# Patient Record
Sex: Male | Born: 1990 | Race: White | Hispanic: No | Marital: Single | Smoking: Never smoker
Health system: Southern US, Community
[De-identification: ages and names within clinical notes are randomized; demographics above are authoritative.]

---
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
## 2013-06-25 ENCOUNTER — Emergency Department: Payer: Self-pay | Admitting: Emergency Medicine

## 2013-06-25 LAB — CBC WITH DIFFERENTIAL/PLATELET
BASOS ABS: 0 10*3/uL (ref 0.0–0.1)
BASOS PCT: 0.3 %
EOS PCT: 2.1 %
Eosinophil #: 0.2 10*3/uL (ref 0.0–0.7)
HCT: 42.9 % (ref 40.0–52.0)
HGB: 14.7 g/dL (ref 13.0–18.0)
Lymphocyte #: 1.8 10*3/uL (ref 1.0–3.6)
Lymphocyte %: 20.7 %
MCH: 31.1 pg (ref 26.0–34.0)
MCHC: 34.3 g/dL (ref 32.0–36.0)
MCV: 91 fL (ref 80–100)
MONOS PCT: 10.8 %
Monocyte #: 0.9 x10 3/mm (ref 0.2–1.0)
NEUTROS ABS: 5.6 10*3/uL (ref 1.4–6.5)
Neutrophil %: 66.1 %
Platelet: 204 10*3/uL (ref 150–440)
RBC: 4.72 10*6/uL (ref 4.40–5.90)
RDW: 13.1 % (ref 11.5–14.5)
WBC: 8.5 10*3/uL (ref 3.8–10.6)

## 2014-04-07 ENCOUNTER — Emergency Department: Payer: Self-pay | Admitting: Emergency Medicine

## 2015-08-29 ENCOUNTER — Encounter: Payer: Self-pay | Admitting: Emergency Medicine

## 2015-08-29 ENCOUNTER — Emergency Department: Payer: Self-pay

## 2015-08-29 ENCOUNTER — Emergency Department
Admission: EM | Admit: 2015-08-29 | Discharge: 2015-08-29 | Disposition: A | Discharge status: Home / Self Care | Payer: Self-pay | Attending: Emergency Medicine | Admitting: Emergency Medicine

## 2015-08-29 DIAGNOSIS — F1290 Cannabis use, unspecified, uncomplicated: Secondary | ICD-10-CM | POA: Insufficient documentation

## 2015-08-29 DIAGNOSIS — Z5321 Procedure and treatment not carried out due to patient leaving prior to being seen by health care provider: Secondary | ICD-10-CM | POA: Insufficient documentation

## 2015-08-29 DIAGNOSIS — R1011 Right upper quadrant pain: Secondary | ICD-10-CM | POA: Insufficient documentation

## 2015-08-29 LAB — URINALYSIS COMPLETE WITH MICROSCOPIC (ARMC ONLY)
BILIRUBIN URINE: NEGATIVE
GLUCOSE, UA: NEGATIVE mg/dL
Hgb urine dipstick: NEGATIVE
LEUKOCYTES UA: NEGATIVE
NITRITE: NEGATIVE
Protein, ur: 100 mg/dL — AB
Specific Gravity, Urine: 1.021 (ref 1.005–1.030)
Squamous Epithelial / LPF: NONE SEEN
pH: 9 — ABNORMAL HIGH (ref 5.0–8.0)

## 2015-08-29 LAB — CBC
HCT: 48.2 % (ref 40.0–52.0)
Hemoglobin: 16.5 g/dL (ref 13.0–18.0)
MCH: 29.9 pg (ref 26.0–34.0)
MCHC: 34.1 g/dL (ref 32.0–36.0)
MCV: 87.6 fL (ref 80.0–100.0)
Platelets: 292 10*3/uL (ref 150–440)
RBC: 5.5 MIL/uL (ref 4.40–5.90)
RDW: 13.6 % (ref 11.5–14.5)
WBC: 17.4 10*3/uL — AB (ref 3.8–10.6)

## 2015-08-29 LAB — LIPASE, BLOOD: Lipase: 26 U/L (ref 11–51)

## 2015-08-29 LAB — COMPREHENSIVE METABOLIC PANEL
ALK PHOS: 51 U/L (ref 38–126)
ALT: 33 U/L (ref 17–63)
AST: 25 U/L (ref 15–41)
Albumin: 4.3 g/dL (ref 3.5–5.0)
Anion gap: 10 (ref 5–15)
BILIRUBIN TOTAL: 0.9 mg/dL (ref 0.3–1.2)
BUN: 11 mg/dL (ref 6–20)
CALCIUM: 9.2 mg/dL (ref 8.9–10.3)
CO2: 25 mmol/L (ref 22–32)
Chloride: 102 mmol/L (ref 101–111)
Creatinine, Ser: 0.83 mg/dL (ref 0.61–1.24)
GFR calc Af Amer: >60 mL/min (ref >60)
GLUCOSE: 115 mg/dL — AB (ref 65–99)
POTASSIUM: 3.6 mmol/L (ref 3.5–5.1)
Sodium: 137 mmol/L (ref 135–145)
TOTAL PROTEIN: 7.2 g/dL (ref 6.5–8.1)

## 2015-08-29 MED ORDER — IOPAMIDOL (ISOVUE-300) INJECTION 61%
100.0000 mL | Freq: Once | INTRAVENOUS | Status: AC | PRN
  Administered 2015-08-29: 100 mL via INTRAVENOUS

## 2015-08-29 MED ORDER — FENTANYL CITRATE (PF) 100 MCG/2ML IJ SOLN
50.0000 ug | INTRAMUSCULAR | Status: AC | PRN
Start: 2015-08-29 — End: 2015-08-29
  Administered 2015-08-29 (×2): 50 ug via INTRAVENOUS

## 2015-08-29 MED ORDER — DIATRIZOATE MEGLUMINE & SODIUM 66-10 % PO SOLN
15.0000 mL | Freq: Once | ORAL | Status: AC
  Administered 2015-08-29: 15 mL via ORAL

## 2015-08-29 MED ORDER — ONDANSETRON HCL 4 MG/2ML IJ SOLN
INTRAMUSCULAR | Status: AC
  Filled 2015-08-29: qty 2

## 2015-08-29 MED ORDER — SODIUM CHLORIDE 0.9 % IV SOLN
Freq: Once | INTRAVENOUS | Status: AC
  Administered 2015-08-29: 15:00:00 via INTRAVENOUS

## 2015-08-29 MED ORDER — FENTANYL CITRATE (PF) 100 MCG/2ML IJ SOLN
INTRAMUSCULAR | Status: AC
Start: 2015-08-29 — End: 2015-08-29
  Administered 2015-08-29: 50 ug via INTRAVENOUS
  Filled 2015-08-29: qty 2

## 2015-08-29 MED ORDER — ONDANSETRON HCL 4 MG/2ML IJ SOLN
4.0000 mg | Freq: Once | INTRAMUSCULAR | Status: AC
  Administered 2015-08-29: 4 mg via INTRAVENOUS

## 2015-08-29 NOTE — ED Notes (Signed)
Patient to ER for c/o RLQ abdominal pain. Patient states pain started at umbilicus, then began pain like "hot lava pain" to RLQ. Patient crying in triage d/t pain. Patient reports several episodes of vomiting before pain. Denies history of kidney stones.

## 2015-08-29 NOTE — ED Notes (Signed)
[place]  Patient upset over wait-time.  Patient asked when he would receive his CT Scan results. Patient informed that he would not receive his results until he saw a physician and that I was unable to give him an exact time.  Patient stated that "This is ridiculous, I have been her 5 1/2 hours"  Tried to reassure patient that he had not been here 5 1/2 hours, rather that he had only been waiting less than 3.  I aplogized to patient for the wait time.  He stated that he was no longer waiting and [facility]

## 2015-08-30 ENCOUNTER — Emergency Department
Admission: EM | Admit: 2015-08-30 | Discharge: 2015-08-31 | Disposition: A | Discharge status: Home / Self Care | Payer: Self-pay | Attending: Emergency Medicine | Admitting: Emergency Medicine

## 2015-08-30 ENCOUNTER — Emergency Department: Payer: Self-pay

## 2015-08-30 ENCOUNTER — Encounter: Payer: Self-pay | Admitting: Emergency Medicine

## 2015-08-30 DIAGNOSIS — F1290 Cannabis use, unspecified, uncomplicated: Secondary | ICD-10-CM | POA: Insufficient documentation

## 2015-08-30 DIAGNOSIS — R1031 Right lower quadrant pain: Secondary | ICD-10-CM | POA: Insufficient documentation

## 2015-08-30 LAB — COMPREHENSIVE METABOLIC PANEL
ALK PHOS: 51 U/L (ref 38–126)
ALT: 32 U/L (ref 17–63)
ANION GAP: 10 (ref 5–15)
AST: 28 U/L (ref 15–41)
Albumin: 4.4 g/dL (ref 3.5–5.0)
BUN: 13 mg/dL (ref 6–20)
CALCIUM: 9.5 mg/dL (ref 8.9–10.3)
CHLORIDE: 102 mmol/L (ref 101–111)
CO2: 29 mmol/L (ref 22–32)
Creatinine, Ser: 0.91 mg/dL (ref 0.61–1.24)
GFR calc non Af Amer: >60 mL/min (ref >60)
Glucose, Bld: 130 mg/dL — ABNORMAL HIGH (ref 65–99)
Potassium: 3.5 mmol/L (ref 3.5–5.1)
SODIUM: 141 mmol/L (ref 135–145)
Total Bilirubin: 0.9 mg/dL (ref 0.3–1.2)
Total Protein: 7.7 g/dL (ref 6.5–8.1)

## 2015-08-30 LAB — CBC
HCT: 48.1 % (ref 40.0–52.0)
HEMOGLOBIN: 16.4 g/dL (ref 13.0–18.0)
MCH: 30 pg (ref 26.0–34.0)
MCHC: 34.1 g/dL (ref 32.0–36.0)
MCV: 87.8 fL (ref 80.0–100.0)
Platelets: 315 10*3/uL (ref 150–440)
RBC: 5.48 MIL/uL (ref 4.40–5.90)
RDW: 13.3 % (ref 11.5–14.5)
WBC: 17.8 10*3/uL — ABNORMAL HIGH (ref 3.8–10.6)

## 2015-08-30 LAB — LIPASE, BLOOD: LIPASE: 12 U/L (ref 11–51)

## 2015-08-30 MED ORDER — ONDANSETRON HCL 4 MG/2ML IJ SOLN
4.0000 mg | Freq: Once | INTRAMUSCULAR | Status: AC
  Administered 2015-08-30: 4 mg via INTRAVENOUS
  Filled 2015-08-30: qty 2

## 2015-08-30 MED ORDER — HYDROMORPHONE HCL 1 MG/ML IJ SOLN
1.0000 mg | Freq: Once | INTRAMUSCULAR | Status: AC
  Administered 2015-08-30: 1 mg via INTRAVENOUS
  Filled 2015-08-30: qty 1

## 2015-08-30 MED ORDER — SODIUM CHLORIDE 0.9 % IV BOLUS (SEPSIS)
1000.0000 mL | Freq: Once | INTRAVENOUS | Status: AC
  Administered 2015-08-30: 1000 mL via INTRAVENOUS

## 2015-08-30 MED ORDER — ONDANSETRON 4 MG PO TBDP
4.0000 mg | ORAL_TABLET | Freq: Once | ORAL | Status: AC | PRN
Start: 2015-08-30 — End: 2015-08-30
  Administered 2015-08-30: 4 mg via ORAL
  Filled 2015-08-30: qty 1

## 2015-08-30 NOTE — ED Notes (Signed)
Phone report to [employer], [employer]ity fundraiser

## 2015-08-30 NOTE — ED Notes (Signed)
Pt anxious in triage; pt says he was seen here last night for N/V and burning abd pain; had CT scan but says he had to leave before he got results; pt says pain is no better

## 2015-08-31 ENCOUNTER — Emergency Department: Payer: Self-pay

## 2015-08-31 MED ORDER — ONDANSETRON HCL 4 MG/2ML IJ SOLN
4.0000 mg | Freq: Once | INTRAMUSCULAR | Status: AC
  Administered 2015-08-31: 4 mg via INTRAVENOUS
  Filled 2015-08-31: qty 2

## 2015-08-31 MED ORDER — OXYCODONE-ACETAMINOPHEN 5-325 MG PO TABS
2.0000 | ORAL_TABLET | Freq: Once | ORAL | Status: AC
  Administered 2015-08-31: 2 via ORAL
  Filled 2015-08-31: qty 2

## 2015-08-31 MED ORDER — IOPAMIDOL (ISOVUE-300) INJECTION 61%
100.0000 mL | Freq: Once | INTRAVENOUS | Status: AC | PRN
  Administered 2015-08-31: 100 mL via INTRAVENOUS

## 2015-08-31 MED ORDER — DIATRIZOATE MEGLUMINE & SODIUM 66-10 % PO SOLN
15.0000 mL | Freq: Once | ORAL | Status: AC
  Administered 2015-08-31: 15 mL via ORAL

## 2015-08-31 MED ORDER — OXYCODONE-ACETAMINOPHEN 5-325 MG PO TABS: 1.0000 | ORAL_TABLET | Freq: Four times a day (QID) | ORAL | Status: DC | PRN

## 2015-08-31 MED ORDER — ONDANSETRON 4 MG PO TBDP: 4.0000 mg | ORAL_TABLET | Freq: Three times a day (TID) | ORAL | Status: DC | PRN

## 2015-08-31 MED ORDER — HYDROMORPHONE HCL 1 MG/ML IJ SOLN
1.0000 mg | Freq: Once | INTRAMUSCULAR | Status: AC
  Administered 2015-08-31: 1 mg via INTRAVENOUS
  Filled 2015-08-31: qty 1

## 2015-08-31 MED ORDER — ONDANSETRON 4 MG PO TBDP
4.0000 mg | ORAL_TABLET | Freq: Once | ORAL | Status: AC
  Administered 2015-08-31: 4 mg via ORAL

## 2015-08-31 MED ORDER — ONDANSETRON 4 MG PO TBDP
ORAL_TABLET | ORAL | Status: AC
  Administered 2015-08-31: 4 mg via ORAL
  Filled 2015-08-31: qty 1

## 2015-08-31 NOTE — ED Notes (Signed)
Patient c/o dry mouth, got him some lemon mouth swabs and they were effective.

## 2015-08-31 NOTE — ED Provider Notes (Signed)
Patient care assumed from Dr. Huel CoteQuigley. Patient continues with significant right lower quadrant tenderness to palpation on my exam. Patient's ultrasound is largely normal besides mild hepatic ST or ptosis. Patient had a normal CT scan yesterday continues to have an elevated white blood cell count of 17,000. She has nauseated has been vomiting at home for 2 days denies any diarrhea. Normal bowel movement yesterday. Given the patient's significant right lower quadrant tenderness to palpation discussed with Dr. Tonita CongWoodham of general surgery. He has reviewed the patient's CT scan, states the appendix appears normal, but given the significant tenderness we may benefit from a repeat CT scan to rule out appendicitis. I discussed this with the patient, he is agreeable to repeat CT scan tonight to rule out appendicitis.  Repeat CT scan shows no acute findings, possible gastritis but the patient's pain is in the right lower quadrant. Appendix is normal. We will discharge the patient on pain and nausea medication have him follow up with his primary care doctor in 2 days for recheck. I discussed return precautions for any worsening pain or fever, patient agreeable.  Minna AntisKevin Sequoyah Counterman, MD 08/31/15 [id number]

## 2015-08-31 NOTE — Discharge Instructions (Signed)
Return to the emergency department for any worsening pain, fever, or any other symptom personally concerning to yourself. Otherwise please follow-up with a primary care physician a 2-3 days for recheck/reevaluation.   Abdominal Pain, Adult Many things can cause belly (abdominal) pain. Most times, the belly pain is not dangerous. Many cases of belly pain can be watched and treated at home. HOME CARE   Do not take medicines that help you go poop (laxatives) unless told to by your doctor.  Only take medicine as told by your doctor.  Eat or drink as told by your doctor. Your doctor will tell you if you should be on a special diet. GET HELP IF:  You do not know what is causing your belly pain.  You have belly pain while you are sick to your stomach (nauseous) or have runny poop (diarrhea).  You have pain while you pee or poop.  Your belly pain wakes you up at night.  You have belly pain that gets worse or better when you eat.  You have belly pain that gets worse when you eat fatty foods.  You have a fever. GET HELP RIGHT AWAY IF:   The pain does not go away within 2 hours.  You keep throwing up (vomiting).  The pain changes and is only in the right or left part of the belly.  You have bloody or tarry looking poop. MAKE SURE YOU:   Understand these instructions.  Will watch your condition.  Will get help right away if you are not doing well or get worse.   This information is not intended to replace advice given to you by your health care provider. Make sure you discuss any questions you have with your health care provider.   Document Released: 09/21/2007 Document Revised: 04/25/2014 Document Reviewed: 12/12/2012 Elsevier Interactive Patient Education [employer]2016 Elsevier Inc.

## 2015-08-31 NOTE — ED Notes (Signed)
Patient returned from CT

## 2015-08-31 NOTE — ED Notes (Signed)
Patient transported to CT 

## 2015-09-09 NOTE — ED Provider Notes (Addendum)
Time Seen: Approximately 2100  I have reviewed the triage notes  Chief Complaint: Abdominal Pain and Emesis   History of Present Illness: Jesse Dominguez is a 25 y.o. male who presents with acute onset of epigastric and diffuse abdominal pain. He states the pain started toward the umbilical area and now radiates toward her right lower quadrant and describes it as a "" hot pain "". Patient states he had some nausea and vomited multiple times prior to his discomfort. He denies any dysuria or hematuria. He denies any left-sided abdominal pain. He is not aware of any fever at home.   History reviewed. No pertinent past medical history.  There are no active problems to display for this patient.   History reviewed. No pertinent past surgical history.  History reviewed. No pertinent past surgical history.  Current Outpatient Rx  Name  Route  Sig  Dispense  Refill  . ondansetron (ZOFRAN ODT) 4 MG disintegrating tablet   Oral   Take 1 tablet (4 mg total) by mouth every 8 (eight) hours as needed for nausea or vomiting.   20 tablet   0   . oxyCODONE-acetaminophen (ROXICET) 5-325 MG tablet   Oral   Take 1 tablet by mouth every 6 (six) hours as needed.   20 tablet   0     Allergies:  Review of patient's allergies indicates no known allergies.  Family History: History reviewed. No pertinent family history.  Social History: Social History  Substance Use Topics  . Smoking status: Never Smoker   . Smokeless tobacco: None  . Alcohol Use: No     Review of Systems:   10 point review of systems was performed and was otherwise negative:  Constitutional: No fever Eyes: No visual disturbances ENT: No sore throat, ear pain Cardiac: No chest pain Respiratory: No shortness of breath, wheezing, or stridor Abdomen: Patient's pain started toward the umbilical area and then started radiating toward her right lower quadrant Endocrine: No weight loss, No night sweats Extremities: No  peripheral edema, cyanosis Skin: No rashes, easy bruising Neurologic: No focal weakness, trouble with speech or swollowing Urologic: No dysuria, Hematuria, or urinary frequency   Physical Exam:  ED Triage Vitals  Enc Vitals Group     BP 08/30/15 2057 136/89 mmHg     Pulse Rate 08/30/15 2057 112     Resp 08/30/15 2057 22     Temp 08/30/15 2057 97.5 F (36.4 C)     Temp Source 08/30/15 2057 Oral     SpO2 08/30/15 2057 100 %     Weight 08/30/15 2057 238 lb (107.956 kg)     Height 08/30/15 2057 6' (1.829 m)     Head Cir --      Peak Flow --      Pain Score 08/30/15 2101 9     Pain Loc --      Pain Edu? --      Excl. in GC? --     General: Awake , Alert , and Oriented times 3; GCS 15 Head: Normal cephalic , atraumatic Eyes: Pupils equal , round, reactive to light Nose/Throat: No nasal drainage, patent upper airway without erythema or exudate.  Neck: Supple, Full range of motion, No anterior adenopathy or palpable thyroid masses Lungs: Clear to ascultation without wheezes , rhonchi, or rales Heart: Regular rate, regular rhythm without murmurs , gallops , or rubs Abdomen:Mild tenderness to deep palpation without rebound, guarding , or rigidity; bowel sounds positive and symmetric  in all 4 quadrants. No organomegaly .        Extremities: 2 plus symmetric pulses. No edema, clubbing or cyanosis Neurologic: normal ambulation, Motor symmetric without deficits, sensory intact Skin: warm, dry, no rashes   Labs:   All laboratory work was reviewed including any pertinent negatives or positives listed below:  Labs Reviewed  COMPREHENSIVE METABOLIC PANEL - Abnormal; Notable for the following:    Glucose, Bld 130 (*)    All other components within normal limits  CBC - Abnormal; Notable for the following:    WBC 17.8 (*)    All other components within normal limits  LIPASE, BLOOD       Radiology:  Final result by Rad Results In Interface (08/31/15 01:47:58)   Narrative:    CLINICAL DATA: 25 year old male with right lower quadrant abdominal pain  EXAM: CT ABDOMEN AND PELVIS WITH CONTRAST  TECHNIQUE: Multidetector CT imaging of the abdomen and pelvis was performed using the standard protocol following bolus administration of intravenous contrast.  CONTRAST: 100mL ISOVUE-300 IOPAMIDOL (ISOVUE-300) INJECTION 61%  COMPARISON: CT dated 08/29/2015 and ultrasound dated 08/30/2015  FINDINGS: The visualized lung bases are clear. No intra-abdominal free air or free fluid.  Diffuse fatty infiltration of the liver. The gallbladder, pancreas, spleen, and adrenal glands appear unremarkable. The kidneys, visualized ureters, and urinary bladder appear unremarkable. The prostate and seminal vesicles are grossly unremarkable.  There is apparent thickening of the gastric antrum and pylorus which may be related to underdistention. Gastritis is less likely but not excluded. Clinical correlation is recommended. There is no evidence of bowel obstruction. Normal appendix.  The abdominal aorta and IVC appear unremarkable. No portal venous gas identified. There is no adenopathy. The abdominal wall soft tissues and the osseous structures appear unremarkable.  IMPRESSION: Underdistention versus gastritis. Clinical correlation is recommended. No bowel obstruction. Normal appendix.  Fatty liver.   Electronically Signed By: Elgie CollardArash Radparvar M.D. On: 08/31/2015 01:47            I personally reviewed the radiologic studies    ED Course: * Patient's abdominal CT was actually pending prior to my departure from the emergency department. I do not have a strong clinical suspicion for an acute abdomen and felt most of his symptoms seem to be viral in nature with. He does not have a lot of diarrhea but certainly we've been seeing a lot of gastroenteritis in any area. Patient's pending CT was checked out to my colleague prior to my departure from the emergency  department Patient was given IV fluids, anti-medic therapy, and anti-pain medication   Assessment: Acute unspecified abdominal pain Final Clinical Impression:  Final diagnoses:  Right lower quadrant abdominal pain     Plan:  Discharge home the CT does not show any evidence of acute appendicitis*            Jennye MoccasinBrian S Quigley, MD 09/09/15 1419  Jennye MoccasinBrian S Quigley, MD 09/09/15 1538

## 2016-08-31 ENCOUNTER — Encounter: Payer: Self-pay | Admitting: Emergency Medicine

## 2016-08-31 ENCOUNTER — Emergency Department
Admission: EM | Admit: 2016-08-31 | Discharge: 2016-08-31 | Disposition: A | Discharge status: Home / Self Care | Payer: Self-pay | Attending: Emergency Medicine | Admitting: Emergency Medicine

## 2016-08-31 DIAGNOSIS — K047 Periapical abscess without sinus: Secondary | ICD-10-CM | POA: Insufficient documentation

## 2016-08-31 MED ORDER — PENICILLIN V POTASSIUM 500 MG PO TABS: 500.0000 mg | ORAL_TABLET | Freq: Four times a day (QID) | ORAL | 0 refills | Status: AC

## 2016-08-31 MED ORDER — PENICILLIN V POTASSIUM 500 MG PO TABS
500.0000 mg | ORAL_TABLET | Freq: Once | ORAL | Status: AC
  Administered 2016-08-31: 500 mg via ORAL
  Filled 2016-08-31: qty 1

## 2016-08-31 NOTE — Discharge Instructions (Signed)
Take medication as prescribed.  ° °Return to emergency department if symptoms worsen and follow-up with PCP as needed.   ° ° °OPTIONS FOR DENTAL FOLLOW UP CARE ° °[place] [facility] - Local Safety Net Dental Clinics °http://www.ncdhhs.gov/dph/oralhealth/services/safetynetclinics.htm °  °[facility] (336-562-3123) ° °[facility] [place] (919-933-9087) ° °[facility] ([phone] ext [phone]) ° °[facility] ([phone]) ° °[facility] ([phone]) °This clinic caters to the indigent population and is on a lottery system. °Location: °[facility], [address], [place] °Clinic Hours: °Wednesdays from 6pm - 9pm, patients seen by a lottery system. °For dates, call or go to www.med.[facility].edu/[facility]/patients/Dental-[facility] °Services: °Cleanings, fillings and simple extractions. °Payment Options: °DENTAL WORK IS FREE OF CHARGE. Bring proof of income or support. °Best way to get seen: °Arrive at 5:15 pm - this is a lottery, NOT first come/first serve, so arriving earlier will not increase your chances of being seen. °  °  °[facility] °919-537-3737 °Select option 1 for emergencies °  °Location: °[facility], [address], [place] °Clinic Hours: °No walk-ins accepted - call the day before to schedule an appointment. °Check in times are 9:30 am and 1:30 pm. °Services: °Simple extractions, temporary fillings, pulpectomy/pulp debridement, uncomplicated abscess drainage. °Payment Options: °PAYMENT IS DUE AT THE TIME OF SERVICE.  Fee is usually $100-200, additional surgical procedures (e.g. abscess drainage) may be extra. °Cash, checks, Visa/MasterCard accepted.  Can file Medicaid if patient is covered for dental - patient should call case worker to check. °No discount for [facility] patients. °Best way to get seen: °MUST call the day before and get onto the  schedule. Can usually be seen the next 1-2 days. No walk-ins accepted. °  °  °[facility] °[phone] °  °Location: °[facility], [address], [place] °Clinic Hours: °M, W, Th, F 8am or 1:30pm, Tues 9a or 1:30 - first come/first served. °Services: °Simple extractions, temporary fillings, uncomplicated abscess drainage.  You do not need to be an [place] resident. °Payment Options: °PAYMENT IS DUE AT THE TIME OF SERVICE. °Dental insurance, otherwise sliding scale - bring proof of income or support. °Depending on income and treatment needed, cost is usually $50-200. °Best way to get seen: °Arrive early as it is first come/first served. °  °  °[facility] °[phone] °  °Location: °[address] °Clinic Hours: °Mon-Thu 8a-5p °Services: °Most basic dental services including extractions and fillings. °Payment Options: °PAYMENT IS DUE AT THE TIME OF SERVICE. °Sliding scale, up to 50% off - bring proof if income or support. °Medicaid with dental option accepted. °Best way to get seen: °Call to schedule an appointment, can usually be seen within 2 weeks OR they will try to see walk-ins - show up at 8a or 2p (you may have to wait). °  °  °[facility] °919-245-2435 °[place] RESIDENTS ONLY °  °Location: °[facility], [address], [place], [place] [address] °Clinic Hours: By appointment only. °Monday - Thursday 8am-5pm, Friday 8am-12pm °Services: Cleanings, fillings, extractions. °Payment Options: °PAYMENT IS DUE AT THE TIME OF SERVICE. °Cash, Visa or MasterCard. Sliding scale - $30 minimum per service. °Best way to get seen: °Come in to office, complete packet and make an appointment - need proof of income °or support monies for each household member and proof of [place] residence. °Usually   takes about a month to get in. °  °  °[facility] °[phone] °   °Location: °[address] [place] °Clinic Hours: Walk-in Urgent Care Dental Services are offered Monday-Friday mornings only. °The numbers of emergencies accepted daily is limited to the number of °providers available. °Maximum 15 - Mondays, Wednesdays & Thursdays °Maximum 10 - Tuesdays & Fridays °Services: °You do not need to be a [place] resident to be seen for a dental emergency. °Emergencies are defined as pain, swelling, abnormal bleeding, or dental trauma. Walkins will receive x-rays if needed. °NOTE: Dental cleaning is not an emergency. °Payment Options: °PAYMENT IS DUE AT THE TIME OF SERVICE. °Minimum co-pay is $40.00 for uninsured patients. °Minimum co-pay is $3.00 for Medicaid with dental coverage. °Dental Insurance is accepted and must be presented at time of visit. °Medicare does not cover dental. °Forms of payment: Cash, credit card, checks. °Best way to get seen: °If not previously registered with the clinic, walk-in dental registration begins at 7:15 am and is on a first come/first serve basis. °If previously registered with the clinic, call to make an appointment. °  °  °[facility] °[phone] °[place] RESIDENTS ONLY °  °Location: °[address], [place], [place] °Clinic Hours: °Mon-Thu 10a-2p °Services: Extractions only! °Payment Options: °FREE (donations accepted) - bring proof of income or support °Best way to get seen: °Call and schedule an appointment OR come at 8am on the 1st Monday of every month (except for holidays) when it is first come/first served. °  °  °[facility] °[phone] °  °Location: °[address], [place] °Clinic Hours: °Friday mornings °Services, Payment Options, Best way to get seen: °Call for info  °

## 2016-08-31 NOTE — ED Provider Notes (Signed)
[facility] Emergency Department Provider Note   ____________________________________________   I have reviewed the triage vital signs and the nursing notes.   HISTORY  Chief Complaint Dental Pain    HPI Jesse Dominguez is a 26 y.o. male presents dental pain x 3 days. Patient complains of swelling and pain along the left side of his lower jaw and cheek.  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 Patient self treated with Tylenol 1000 mg and Aleve OTC earlier today with minimal relief. Patient denies fever, chills, headache, vision changes, shortness of breath, abdominal pain, nausea and vomiting.  History reviewed. No pertinent past medical history.  There are no active problems to display for this patient.   No past surgical history on file.  Prior to Admission medications   Medication Sig Start Date End Date Taking? Authorizing Provider  ondansetron (ZOFRAN ODT) 4 MG disintegrating tablet Take 1 tablet (4 mg total) by mouth every 8 (eight) hours as needed for nausea or vomiting. 08/31/15   Minna Antis, MD  oxyCODONE-acetaminophen (ROXICET) 5-325 MG tablet Take 1 tablet by mouth every 6 (six) hours as needed. 08/31/15   Minna Antis, MD  penicillin v potassium (VEETID) 500 MG tablet Take 1 tablet (500 mg total) by mouth 4 (four) times daily. 08/31/16 09/07/16  Clancy Mullarkey, Jordan Likes, PA-C    Allergies Patient has no known allergies.  No family history on file.  Social History Social History  Substance Use Topics  . Smoking status: Never Smoker  . Smokeless tobacco: Never Used  . Alcohol use No    Review of Systems Constitutional: Negative for fever/chills Eyes: No visual changes. ENT: Negative for sore throat. Negative for difficulty swallowing Difficulty talking and left lower jaw dental secondary to swelling and pain Cardiovascular: Denies chest  pain. Respiratory: Denies cough Denies shortness of breath. Gastrointestinal: No abdominal pain.  No nausea, vomiting, diarrhea. Musculoskeletal: Negative for generalized body aches. Skin:Negative for rash. Neurological: Negative for headaches. Negative focal weakness or numbness.  ____________________________________________   PHYSICAL EXAM:  VITAL SIGNS: ED Triage Vitals  Enc Vitals Group     BP 08/31/16 0956 130/80     Pulse Rate 08/31/16 0956 91     Resp 08/31/16 0956 16     Temp 08/31/16 0956 98.3 F (36.8 C)     Temp Source 08/31/16 0956 Oral     SpO2 08/31/16 0956 97 %     Weight 08/31/16 0953 231 lb (104.8 kg)     Height 08/31/16 0953 6' (1.829 m)     Head Circumference --      Peak Flow --      Pain Score 08/31/16 1007 4     Pain Loc --      Pain Edu? --      Excl. in GC? --     Constitutional: Alert and oriented. Well appearing and in no acute distress. Afebrile Head: Normocephalic and atraumatic. Eyes: Conjunctivae are normal. PERRL. Normal extraocular movements. Nose: No congestion/rhinorrhea/epistaxis.  Mouth/Throat: Mucous membranes are moist. Dental caries, inflamed, erythematous left lower gumline localized in the gumline. Tender to palpation along soft tissue mandibular region, no induration noted in facial soft tissue.  Neck: Supple.  Hematological/Lymphatic/Immunological: No cervical lymphadenopathy. Cardiovascular: Normal rate, regular rhythm. Normal distal pulses. Respiratory: Normal respiratory effort. Lungs CTAB. Gastrointestinal: Soft and nontender. No distention. Musculoskeletal: Nontender with normal range of motion in all extremities.  Neurologic: Normal speech and language. No gross focal neurologic deficits are appreciated. Skin:  Skin is warm, dry and intact. No rash noted. Psychiatric: Mood and affect are normal. ____________________________________________   LABS (all labs ordered are listed, but only abnormal results are  displayed)  Labs Reviewed - No data to display ____________________________________________  EKG none ____________________________________________  RADIOLOGY none ____________________________________________   PROCEDURES  Procedure(s) performed:no    Critical Care performed: no ____________________________________________   INITIAL IMPRESSION / ASSESSMENT AND PLAN / ED COURSE  Pertinent labs & imaging results that were available during my care of the patient were reviewed by me and considered in my medical decision making (see chart for details).   Patient presents with left lower dental pain, erythema, inflammation consistent with infection possible abscess. Significant dental caries and poor dental hygiene contributing. Patient is afebrile at this time. Patient will be provided prescription for empiric antibiotic therapy, Penicillin V to begin until he attends dental appointment. Patient will be following up with a schedule dental appointment next Wednesday. Also instructed to rinse mouth with warm salt water.   Patient informed of clinical course, understand medical decision-making process, and agree with plan.  Patient was advised to follow up dental clinic and was also advised to return to the emergency department for symptoms that change or worsen if unable to schedule an appointment.      ____________________________________________   FINAL CLINICAL IMPRESSION(S) / ED DIAGNOSES  Final diagnoses:  Dental infection       NEW MEDICATIONS STARTED DURING THIS VISIT:  Discharge Medication List as of 08/31/2016 10:45 AM    START taking these medications   Details  penicillin v potassium (VEETID) 500 MG tablet Take 1 tablet (500 mg total) by mouth 4 (four) times daily., Starting Wed 08/31/2016, Until Wed 09/07/2016, Print         Note:  This document was prepared using Dragon voice recognition software and may include unintentional dictation errors.    Clois ComberLittle, Rewa Weissberg M, PA-C 08/31/16 1441    Aidan Caloca, Jordan Likesraci M, PA-C 08/31/16 1605    Merrily Brittleifenbark, Neil, MD 09/01/16 1247

## 2016-08-31 NOTE — ED Triage Notes (Signed)
C/O left lower jaw dental pain and swelling x 3 days.  Has taken 1000 mg of Tylenol this morning at 0630 and 1 OTC Alleve at 0630.  Patient has dentist appointment scheduled for next Wednesday

## 2016-08-31 NOTE — ED Notes (Signed)
Patient presents to the ED with swelling to the left side of his mouth/cheek.  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  Patient is in no obvious distress at this time.

## 2016-09-04 IMAGING — CT CT ABD-PELV W/ CM
2 of 4 series · 16 of 46 positions shown, 18 images · IV contrast (iopamidol)
Comparison: CT dated 08/29/2015 and ultrasound dated 08/30/2015

CLINICAL DATA: 24-year-old male with right lower quadrant abdominal
pain

EXAM:
CT ABDOMEN AND PELVIS WITH CONTRAST
TECHNIQUE: Multidetector CT imaging of the abdomen and pelvis was performed
using the standard protocol following bolus administration of
intravenous contrast.
CONTRAST:  100mL 8LGFOM-JTT IOPAMIDOL (8LGFOM-JTT) INJECTION 61%

[Series 2: routine abd pel with · axial · 0.77mm/px · z∈[-505,-25]mm · 13 of 106 slices shown, 15 images]
[im 5/106  soft-tissue]
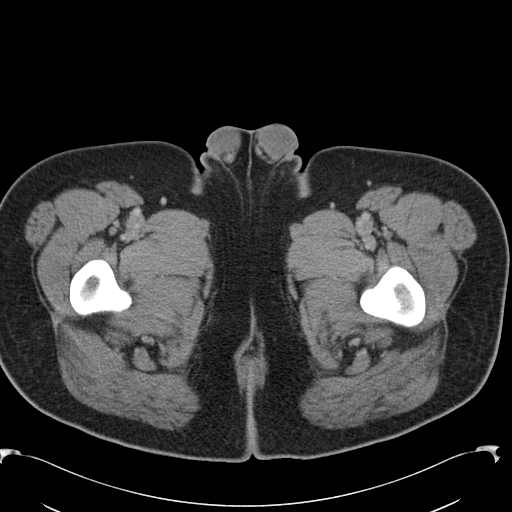
[im 5/106  bone]
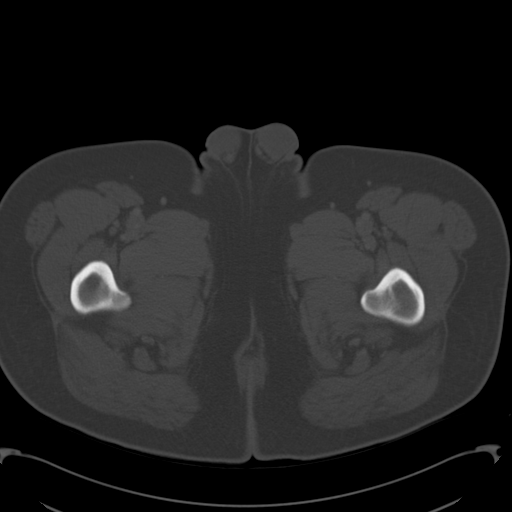
[im 14/106  soft-tissue]
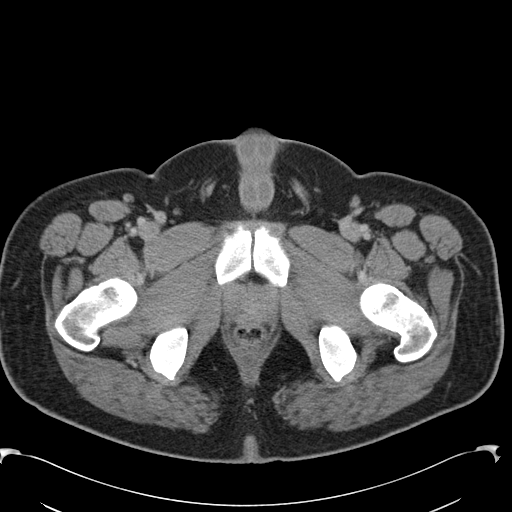
[im 22/106  soft-tissue]
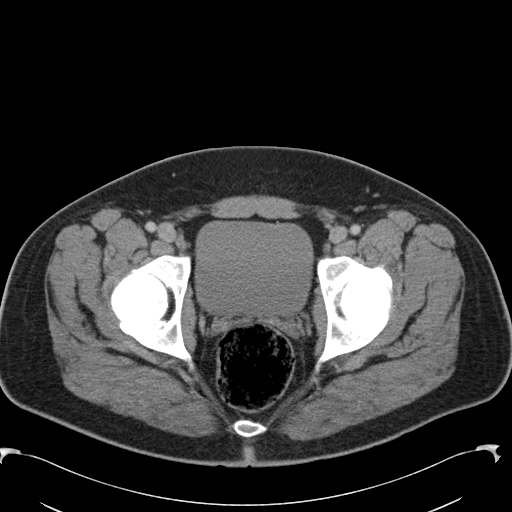
[im 31/106  soft-tissue]
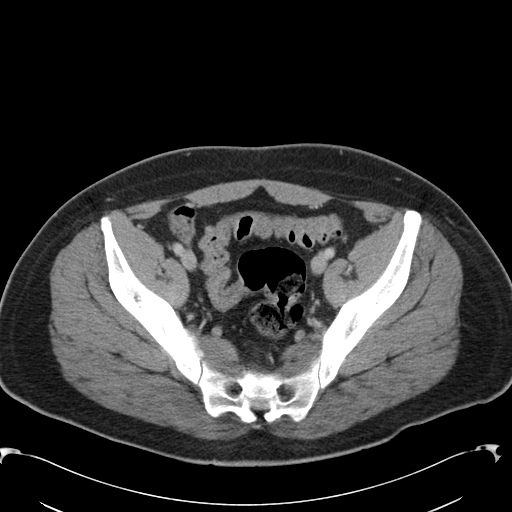
[im 36/106  soft-tissue]
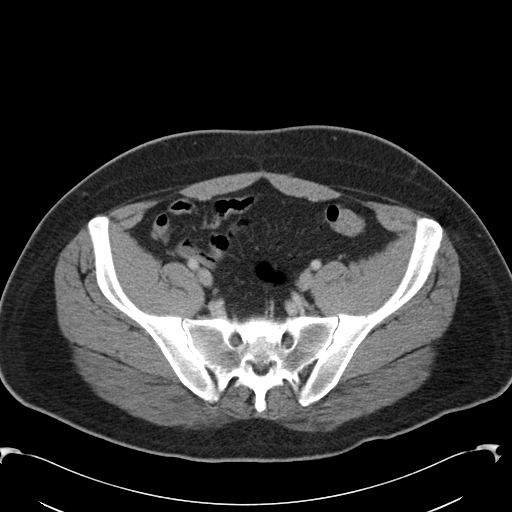
[im 44/106  soft-tissue]
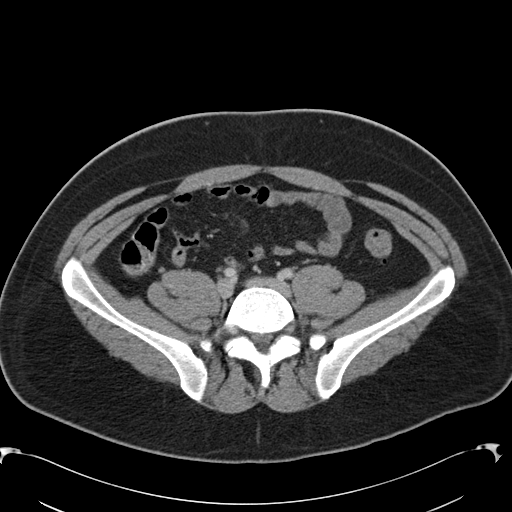
[im 53/106  soft-tissue]
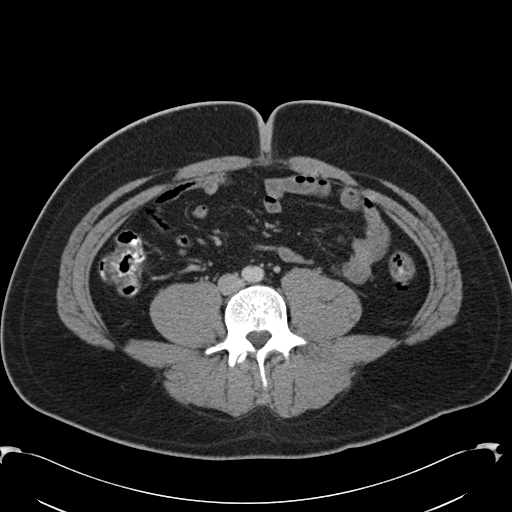
[im 62/106  soft-tissue]
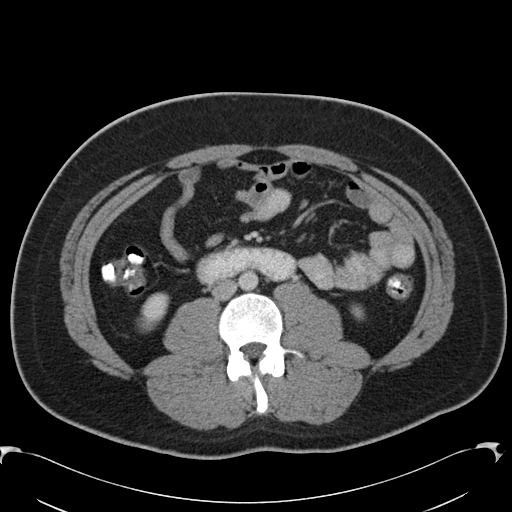
[im 71/106  soft-tissue]
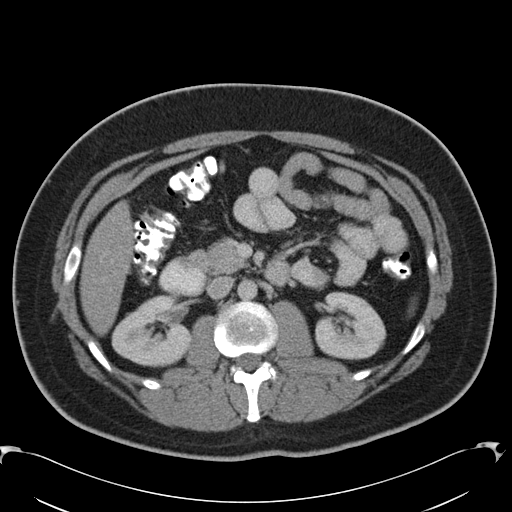
[im 71/106  bone]
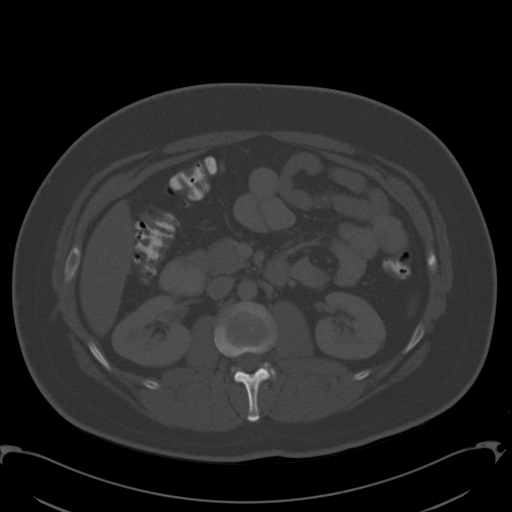
[im 75/106  soft-tissue]
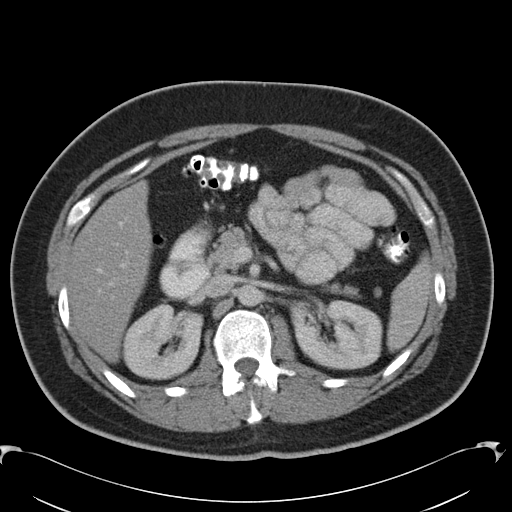
[im 84/106  soft-tissue]
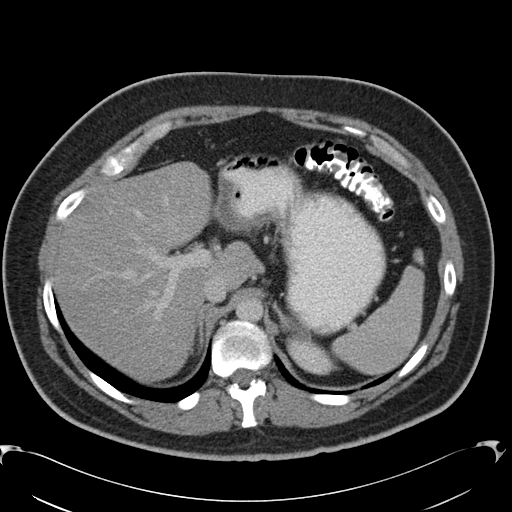
[im 92/106  soft-tissue]
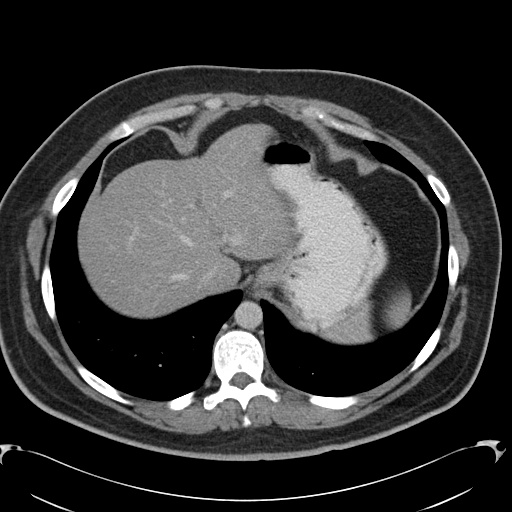
[im 101/106  soft-tissue]
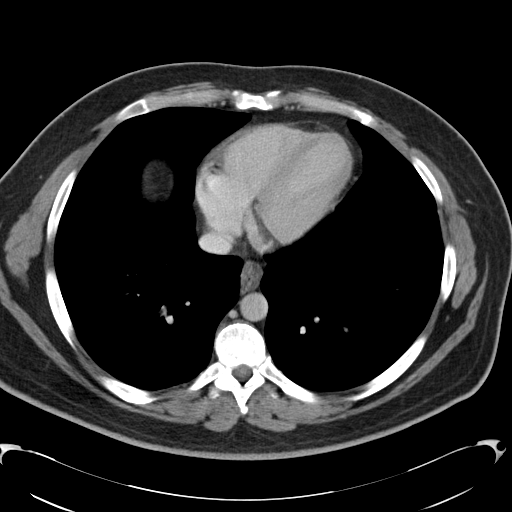

[Series 5: cor routine abd pel with · coronal · 0.78mm/px · 3 of 147 slices shown]
[im 49/147  soft-tissue]
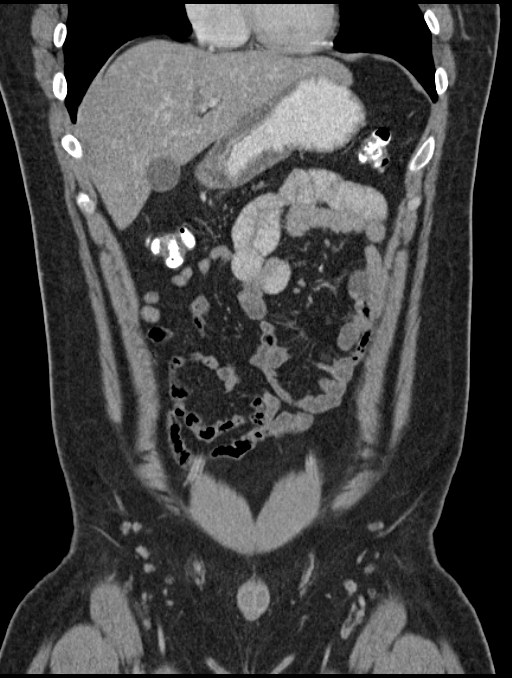
[im 65/147  soft-tissue]
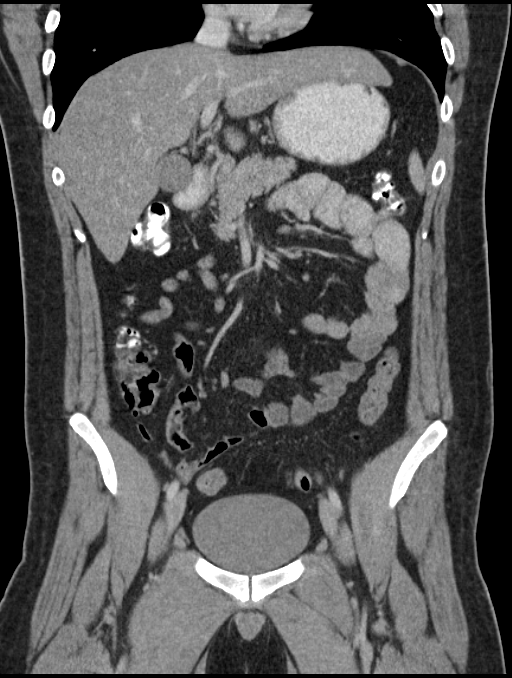
[im 82/147  soft-tissue]
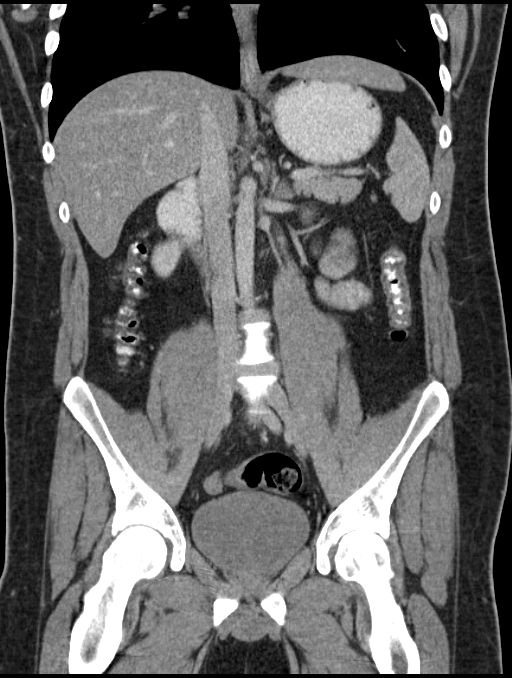

[16 of 46 positions shown; findings below may reference images not displayed]

FINDINGS: The visualized lung bases are clear. No intra-abdominal free air or
free fluid.

Diffuse fatty infiltration of the liver. The gallbladder, pancreas,
spleen, and adrenal glands appear unremarkable. The kidneys,
visualized ureters, and urinary bladder appear unremarkable. The
prostate and seminal vesicles are grossly unremarkable.

There is apparent thickening of the gastric antrum and pylorus which
may be related to underdistention. Gastritis is less likely but not
excluded. Clinical correlation is recommended. There is no evidence
of bowel obstruction. Normal appendix.

The abdominal aorta and IVC appear unremarkable. No portal venous
gas identified. There is no adenopathy. The abdominal wall soft
tissues and the osseous structures appear unremarkable.
IMPRESSION: Underdistention versus gastritis. Clinical correlation is
recommended. No bowel obstruction. Normal appendix.

Fatty liver.

## 2017-03-14 ENCOUNTER — Emergency Department: Payer: Self-pay

## 2017-03-14 ENCOUNTER — Other Ambulatory Visit: Payer: Self-pay

## 2017-03-14 ENCOUNTER — Emergency Department
Admission: EM | Admit: 2017-03-14 | Discharge: 2017-03-14 | Disposition: A | Discharge status: Home / Self Care | Payer: Self-pay | Attending: Emergency Medicine | Admitting: Emergency Medicine

## 2017-03-14 ENCOUNTER — Encounter: Payer: Self-pay | Admitting: Emergency Medicine

## 2017-03-14 DIAGNOSIS — M533 Sacrococcygeal disorders, not elsewhere classified: Secondary | ICD-10-CM | POA: Insufficient documentation

## 2017-03-14 DIAGNOSIS — L0591 Pilonidal cyst without abscess: Secondary | ICD-10-CM | POA: Insufficient documentation

## 2017-03-14 MED ORDER — TRAMADOL HCL 50 MG PO TABS: 50.0000 mg | ORAL_TABLET | Freq: Four times a day (QID) | ORAL | 0 refills | Status: AC | PRN

## 2017-03-14 MED ORDER — SULFAMETHOXAZOLE-TRIMETHOPRIM 800-160 MG PO TABS: 1.0000 | ORAL_TABLET | Freq: Two times a day (BID) | ORAL | 0 refills | Status: DC

## 2017-03-14 NOTE — ED Notes (Signed)
See triage note  States he developed pain to tailbone area for the past 2 days  Denies any injury.ambulates well to treatment room

## 2017-03-14 NOTE — ED Triage Notes (Signed)
Pt with tailbone pain for two day, no known injury.

## 2017-03-14 NOTE — Discharge Instructions (Signed)
All up with their regular doctor if you're not better in 3-5 days, use medication as prescribed, if you notice a large abscess appearing in the area please return to the emergency room for incision and drainage, take over-the-counter Tylenol and ibuprofen as needed, he will be given a prescription for an antibiotic and pain medication

## 2017-03-14 NOTE — ED Notes (Signed)
Pt alert and oriented X4, active, cooperative, pt in NAD. RR even and unlabored, color WNL.  Pt informed to return if any life threatening symptoms occur.  Discharge and followup instructions reviewed.  

## 2017-03-14 NOTE — ED Provider Notes (Signed)
[facility] Emergency Department Provider Note  ____________________________________________   First MD Initiated Contact with Patient 03/14/17 1026     (approximate)  I have reviewed the triage vital signs and the nursing notes.   HISTORY  Chief Complaint Tailbone Pain    HPI Jesse Dominguez is a 26 y.o. male complains of pain in the tailbone area, states he had a fracture a few years ago, states the pain feels the same, has not had an injury that he knows of, denies fever chills, did not take any over-the-counter medicines for pain   History reviewed. No pertinent past medical history.  There are no active problems to display for this patient.   History reviewed. No pertinent surgical history.  Prior to Admission medications   Medication Sig Start Date End Date Taking? Authorizing Provider  sulfamethoxazole-trimethoprim (BACTRIM DS,SEPTRA DS) 800-160 MG tablet Take 1 tablet by mouth 2 (two) times daily. 03/14/17   Sherrie MustacheFisher, Roselyn BeringSusan W, PA-C  traMADol (ULTRAM) 50 MG tablet Take 1 tablet (50 mg total) by mouth every 6 (six) hours as needed. 03/14/17 03/14/18  Faythe GheeFisher, Kanyah Matsushima W, PA-C    Allergies Patient has no known allergies.  No family history on file.  Social History Social History   Tobacco Use  . Smoking status: Never Smoker  . Smokeless tobacco: Never Used  Substance Use Topics  . Alcohol use: No  . Drug use: Yes    Types: Marijuana    Comment: last used Thursday    Review of Systems  Constitutional: No fever/chills Eyes: No visual changes. ENT: No sore throat. Respiratory: Denies cough Genitourinary: Negative for dysuria. Musculoskeletal: Positive for tailbone pain. Skin: Negative for rash.    ____________________________________________   PHYSICAL EXAM:  VITAL SIGNS: ED Triage Vitals  Enc Vitals Group     BP 03/14/17 1003 135/78     Pulse Rate 03/14/17 1003 86     Resp 03/14/17 1003 18     Temp 03/14/17 1003 98 F  (36.7 C)     Temp Source 03/14/17 1003 Oral     SpO2 03/14/17 1003 100 %     Weight 03/14/17 0941 232 lb (105.2 kg)     Height 03/14/17 0941 6' (1.829 m)     Head Circumference --      Peak Flow --      Pain Score 03/14/17 0941 5     Pain Loc --      Pain Edu? --      Excl. in [place]? --     Constitutional: Alert and oriented. Well appearing and in no acute distress. Eyes: Conjunctivae are normal.  Head: Atraumatic. Nose: No congestion/rhinnorhea. Mouth/Throat: Mucous membranes are moist.   Cardiovascular: Normal rate, regular rhythm. Respiratory: Normal respiratory effort.  No retractions GU: deferred Musculoskeletal: FROM all extremities, warm and well perfused, coccyx is tender to palpation, the area below the Kocsis is swollen and tender, no actual abscess noted Neurologic:  Normal speech and language.  Skin:  Skin is warm, dry and intact. No rash noted. Psychiatric: Mood and affect are normal. Speech and behavior are normal.  ____________________________________________   LABS (all labs ordered are listed, but only abnormal results are displayed)  Labs Reviewed - No data to display ____________________________________________   ____________________________________________  RADIOLOGY  X-ray of coccyx and sacrum is negative. ____________________________________________   PROCEDURES  Procedure(s) performed: No      ____________________________________________   INITIAL IMPRESSION / ASSESSMENT AND PLAN / ED COURSE  Pertinent  labs & imaging results that were available during my care of the patient were reviewed by me and considered in my medical decision making (see chart for details).  Patient is a 26 year old male with a history of a fractured coccyx, he presents today with increased pain to the tailbone area, denies any new injury, denies fever or chills, on physical exam the coccyx area is swollen and tender to palpation, x-ray of the coccyx and sacrum is  ordered    ----------------------------------------- 11:29 AM on 03/14/2017 -----------------------------------------  Patient's x-ray is normal, discussed pilonidal cyst with patient, prescription for Septra DS 1 by mouth twice a day for 7 days was given,  prescription for tramadol 50 mg #10 with no refill given, a she was instructed to follow-up with the acute care if not better in 3-5 days, to return to the emergency department if worsening, information was given about pilonidal cyst, and tailbone injuries   ____________________________________________   FINAL CLINICAL IMPRESSION(S) / ED DIAGNOSES  Final diagnoses:  Coccyx pain  Pilonidal cyst      NEW MEDICATIONS STARTED DURING THIS VISIT:  This SmartLink is deprecated. Use AVSMEDLIST instead to display the medication list for a patient.   Note:  This document was prepared using Dragon voice recognition software and may include unintentional dictation errors.    Faythe GheeFisher, Lisanne Ponce W, PA-C 03/14/17 1132    Jene EveryKinner, Robert, MD 03/14/17 [phone]

## 2017-03-16 ENCOUNTER — Encounter: Payer: Self-pay | Admitting: Intensive Care

## 2017-03-16 ENCOUNTER — Emergency Department
Admission: EM | Admit: 2017-03-16 | Discharge: 2017-03-16 | Disposition: A | Discharge status: Home / Self Care | Payer: Self-pay | Attending: Emergency Medicine | Admitting: Emergency Medicine

## 2017-03-16 DIAGNOSIS — L0501 Pilonidal cyst with abscess: Secondary | ICD-10-CM | POA: Insufficient documentation

## 2017-03-16 MED ORDER — OXYCODONE-ACETAMINOPHEN 7.5-325 MG PO TABS: 1.0000 | ORAL_TABLET | ORAL | 0 refills | Status: DC | PRN

## 2017-03-16 MED ORDER — HYDROMORPHONE HCL 1 MG/ML IJ SOLN
1.0000 mg | Freq: Once | INTRAMUSCULAR | Status: AC
  Administered 2017-03-16: 1 mg via INTRAMUSCULAR
  Filled 2017-03-16: qty 1

## 2017-03-16 NOTE — Discharge Instructions (Signed)
Continue previous medication and follow discharge care instructions. °

## 2017-03-16 NOTE — ED Notes (Signed)
Area is draining at present  Provider aware

## 2017-03-16 NOTE — ED Provider Notes (Signed)
[facility] Emergency Department Provider Note   ____________________________________________   None    (approximate)  I have reviewed the triage vital signs and the nursing notes.   HISTORY  Chief Complaint Abscess    HPI Jesse Dominguez is a 26 y.o. male patient presents with follow-up of pilonidal cyst which was nonfluctuant 2 days ago.Patient placed on antibiotics and tramadol. Patient state increased pressure today. Patient state after being brought to the emergency room and placed in a gown he laid down and noticed spontaneous relief with drainage from the cystic lesion. Patient state initial pain was 8/10. Patient rates the pain as a 4/10 at this time.   History reviewed. No pertinent past medical history.  There are no active problems to display for this patient.   History reviewed. No pertinent surgical history.  Prior to Admission medications   Medication Sig Start Date End Date Taking? Authorizing Provider  oxyCODONE-acetaminophen (PERCOCET) 7.5-325 MG tablet Take 1 tablet by mouth every 4 (four) hours as needed for severe pain. 03/16/17   Joni ReiningSmith, Libra Gatz K, PA-C  sulfamethoxazole-trimethoprim (BACTRIM DS,SEPTRA DS) 800-160 MG tablet Take 1 tablet by mouth 2 (two) times daily. 03/14/17   Sherrie MustacheFisher, Roselyn BeringSusan W, PA-C  traMADol (ULTRAM) 50 MG tablet Take 1 tablet (50 mg total) by mouth every 6 (six) hours as needed. 03/14/17 03/14/18  Faythe GheeFisher, [name], PA-C    Allergies Patient has no known allergies.  History reviewed. No pertinent family history.  Social History Social History   Tobacco Use  . Smoking status: Never Smoker  . Smokeless tobacco: Never Used  Substance Use Topics  . Alcohol use: No  . Drug use: Yes    Types: Marijuana    Comment: last used Thursday    Review of Systems Constitutional: No fever/chills Eyes: No visual changes. ENT: No sore throat. Cardiovascular: Denies chest pain. Respiratory: Denies shortness of  breath. Gastrointestinal: No abdominal pain.  No nausea, no vomiting.  No diarrhea.  No constipation. Genitourinary: Negative for dysuria. Musculoskeletal: Negative for back pain. Skin: Pilonidal cyst Neurological: Negative for headaches, focal weakness or numbness.  ____________________________________________   PHYSICAL EXAM:  VITAL SIGNS: ED Triage Vitals  Enc Vitals Group     BP 03/16/17 1106 139/75     Pulse Rate 03/16/17 1106 (!) 107     Resp 03/16/17 1106 16     Temp 03/16/17 1106 98.4 F (36.9 C)     Temp Source 03/16/17 1106 Oral     SpO2 03/16/17 1106 98 %     Weight 03/16/17 1107 240 lb (108.9 kg)     Height 03/16/17 1107 5\' 11"  (1.803 m)     Head Circumference --      Peak Flow --      Pain Score 03/16/17 1106 8     Pain Loc --      Pain Edu? --      Excl. in [place]? --    Constitutional: Alert and oriented. Well appearing and in no acute distress. Cardiovascular: Normal rate, regular rhythm. Grossly normal heart sounds.  Good peripheral circulation. Respiratory: Normal respiratory effort.  No retractions. Lungs CTAB. Gastrointestinal: Soft and nontender. No distention. No abdominal bruits. No CVA tenderness. Musculoskeletal: No lower extremity tenderness nor edema.  No joint effusions. Neurologic:  Normal speech and language. No gross focal neurologic deficits are appreciated. No gait instability. Skin: Erythematous and edematous nausea lesion left buttocks. Coarse amount of active drainage of purulent material .  Psychiatric: Mood  and affect are normal. Speech and behavior are normal.  ____________________________________________   LABS (all labs ordered are listed, but only abnormal results are displayed)  Labs Reviewed - No data to display ____________________________________________  EKG   ____________________________________________  RADIOLOGY  No results found.  ____________________________________________   PROCEDURES  Procedure(s)  performed: None  Procedures  Critical Care performed: No  ____________________________________________   INITIAL IMPRESSION / ASSESSMENT AND PLAN / ED COURSE  As part of my medical decision making, I reviewed the following data within the electronic MEDICAL RECORD NUMBER    Pilonidal cyst with active drainage. Manually compress lesion until no discharge was expressed. Area was clean and dress. Patient given discharge care instructions and advised continue medication. Patient advised to follow up by contacting surgical department for definitive evaluation and treatment.    ____________________________________________   FINAL CLINICAL IMPRESSION(S) / ED DIAGNOSES  Final diagnoses:  Pilonidal abscess     ED Discharge Orders        Ordered    oxyCODONE-acetaminophen (PERCOCET) 7.5-325 MG tablet  Every 4 hours PRN     03/16/17 1238       Note:  This document was prepared using Dragon voice recognition software and may include unintentional dictation errors.    Joni ReiningSmith, Infant Zink K, PA-C 03/16/17 1254    Jene EveryKinner, Robert, MD 03/16/17 1310

## 2017-03-16 NOTE — ED Triage Notes (Signed)
Patient reports he has a cyst at the top of his buttocks and was seen here two days ago and given antibiotics and tramadol. Since it has gotten bigger and redness around area has grown.

## 2017-03-16 NOTE — ED Notes (Signed)
See triage note  States abscess area is getting larger and having increased pain

## 2018-03-19 IMAGING — CR DG SACRUM/COCCYX 2+V
1 series · 3 of 3 positions shown · non-contrast
Comparison: None.

CLINICAL DATA: Sacrum coccyx pain for 2 days with no reported
injury. Previous coccyx fracture in 2893.

EXAM:
SACRUM AND COCCYX - 2+ VIEW

[Series 1: dg sacrum/coccyx · 0.14mm/px · 3 of 3 slices shown]
[im 1/3]
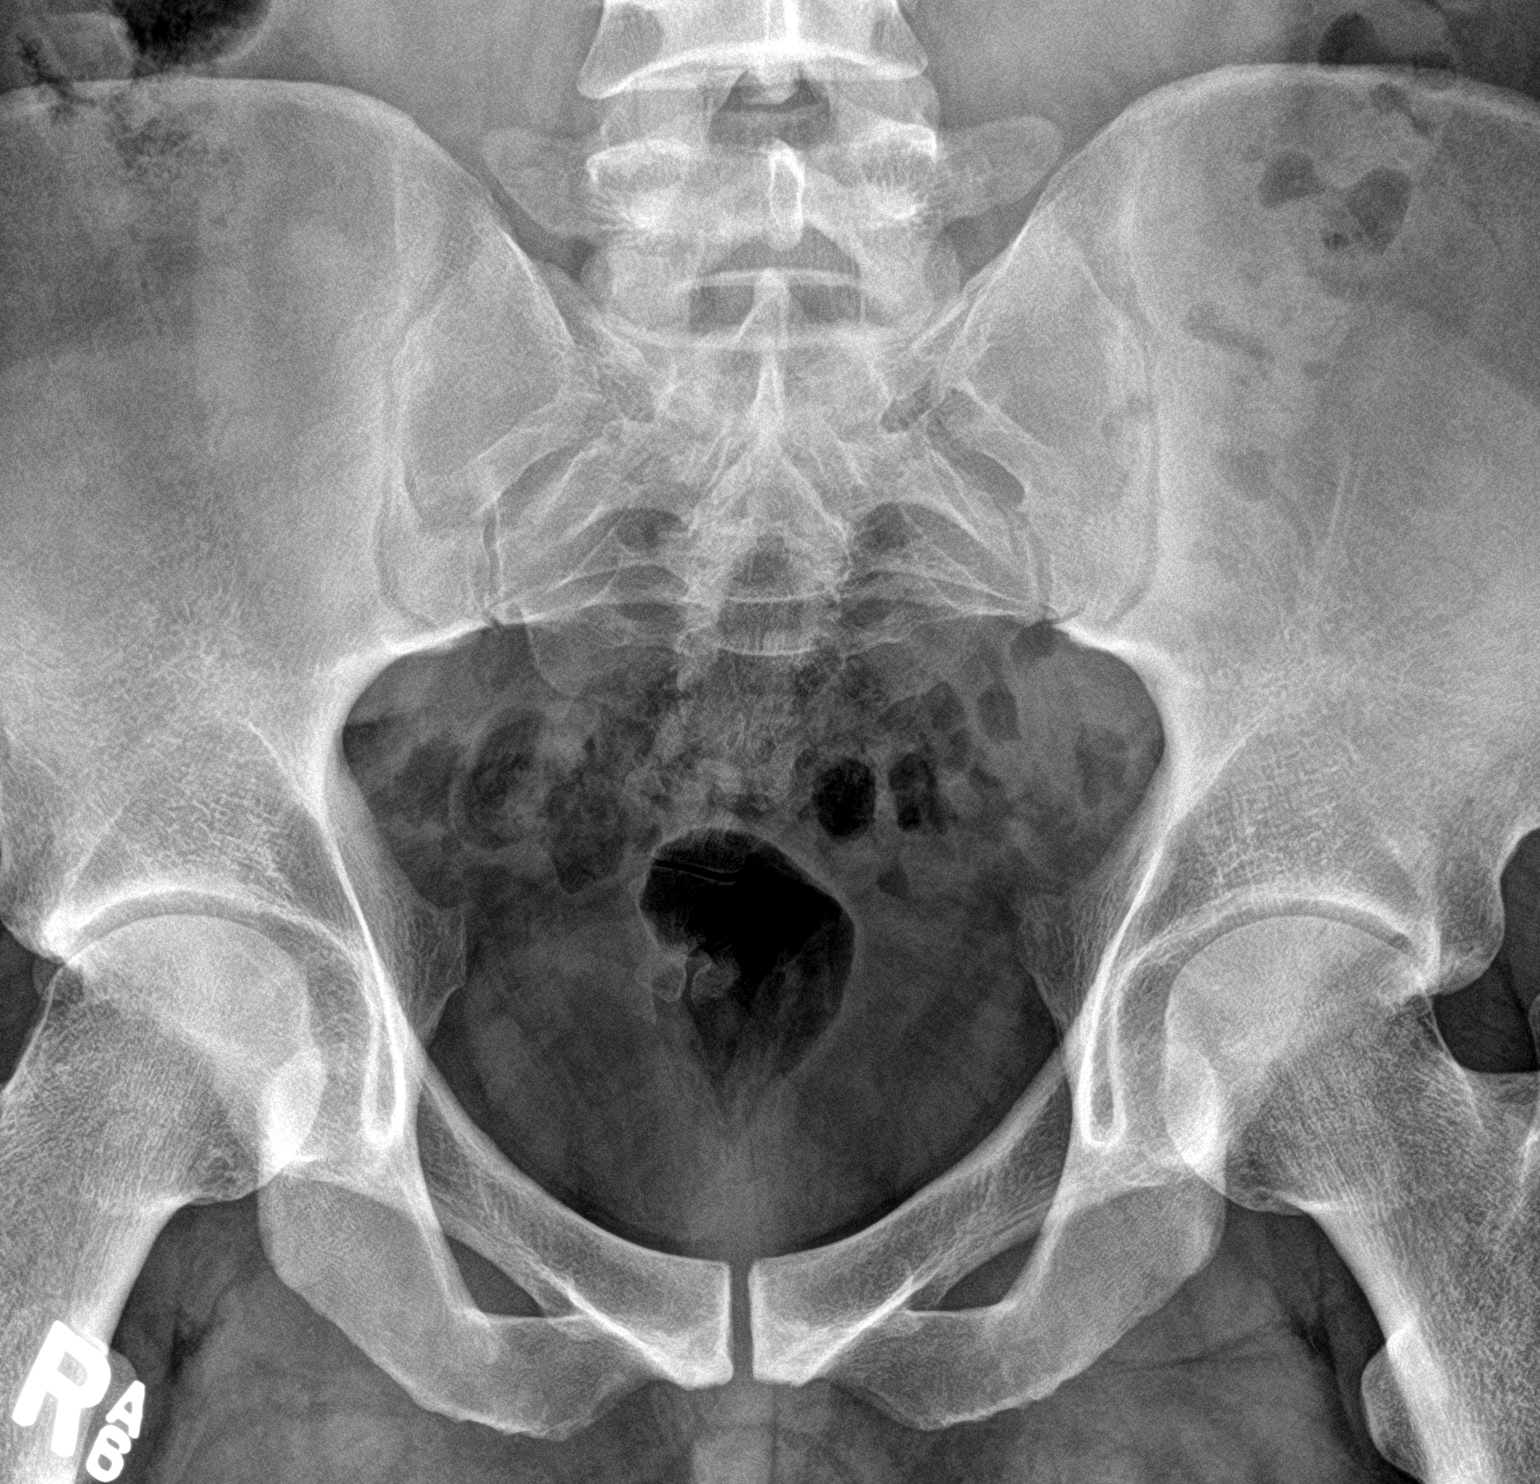
[im 2/3]
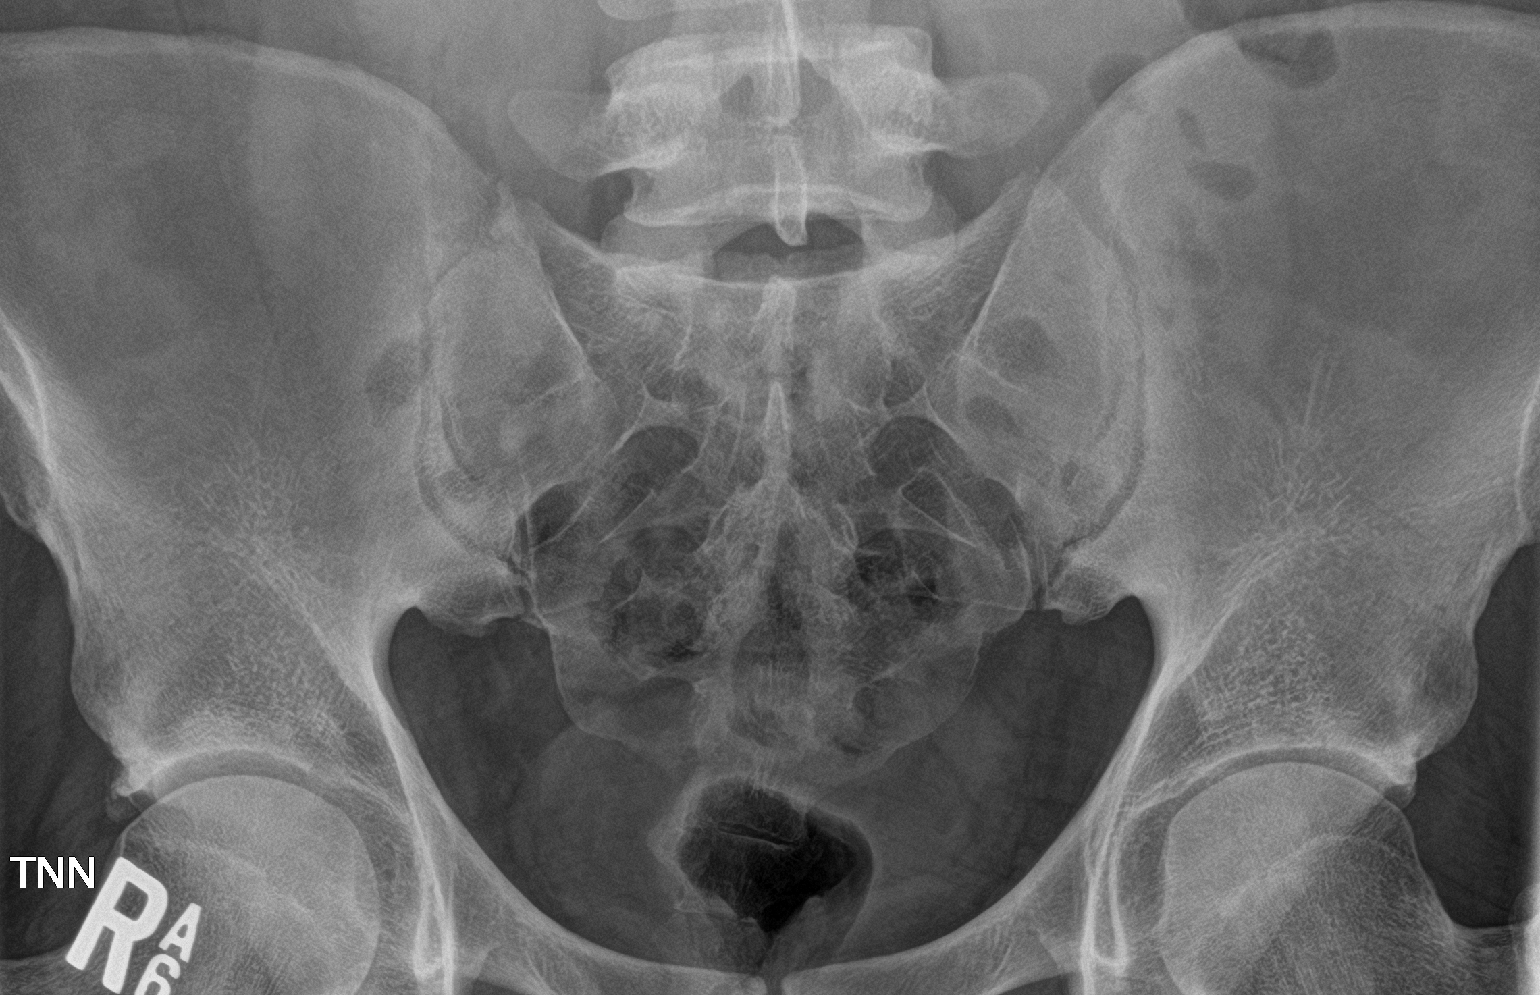
[im 3/3]
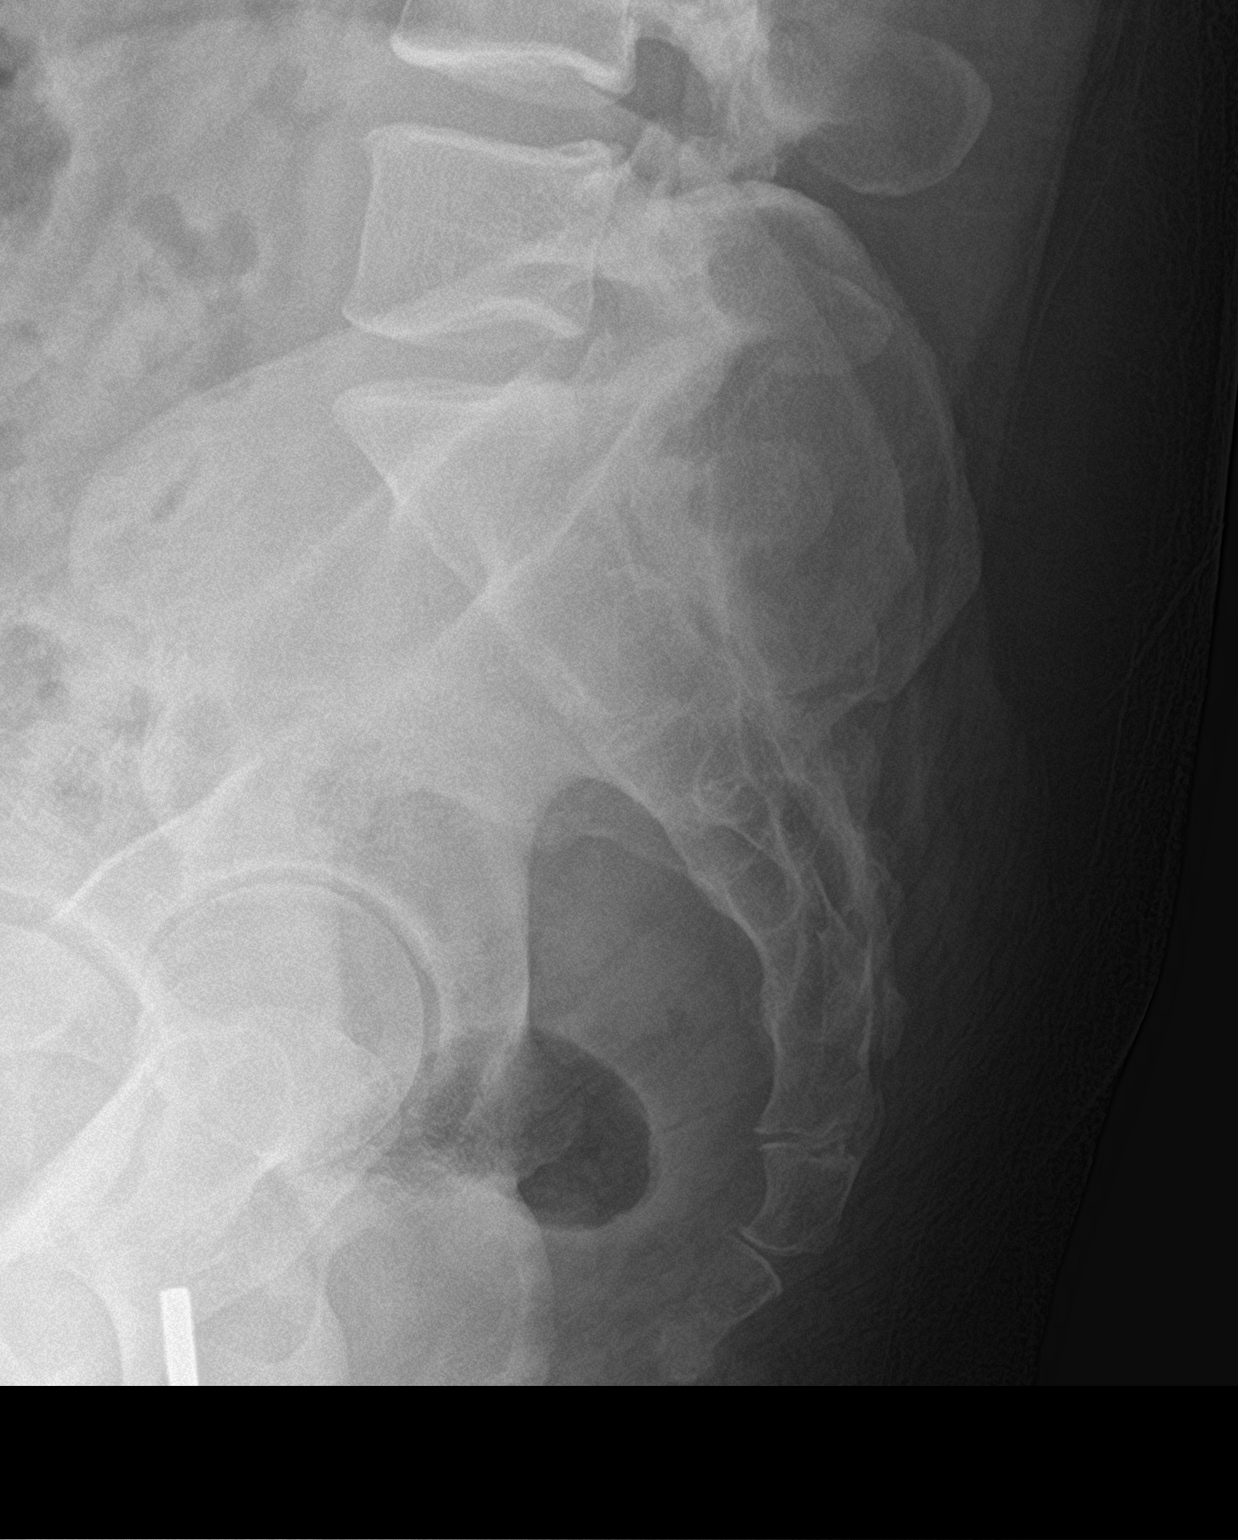

[3 of 3 positions shown; findings below may reference images not displayed]

FINDINGS: There is no evidence of fracture or other focal bone lesions.
IMPRESSION: Negative.

## 2019-01-16 ENCOUNTER — Other Ambulatory Visit: Payer: Self-pay

## 2019-01-16 ENCOUNTER — Encounter: Payer: Self-pay | Admitting: Emergency Medicine

## 2019-01-16 ENCOUNTER — Emergency Department
Admission: EM | Admit: 2019-01-16 | Discharge: 2019-01-16 | Disposition: A | Discharge status: Home / Self Care | Payer: Self-pay | Attending: Emergency Medicine | Admitting: Emergency Medicine

## 2019-01-16 DIAGNOSIS — K047 Periapical abscess without sinus: Secondary | ICD-10-CM | POA: Insufficient documentation

## 2019-01-16 DIAGNOSIS — F1210 Cannabis abuse, uncomplicated: Secondary | ICD-10-CM | POA: Insufficient documentation

## 2019-01-16 MED ORDER — AMOXICILLIN 500 MG PO CAPS: 500.0000 mg | ORAL_CAPSULE | Freq: Three times a day (TID) | ORAL | 0 refills | Status: DC

## 2019-01-16 NOTE — ED Provider Notes (Signed)
[facility] Emergency Department Provider Note  ____________________________________________   First MD Initiated Contact with Patient 01/16/19 1708     (approximate)  I have reviewed the triage vital signs and the nursing notes.   HISTORY  Chief Complaint Dental Pain    HPI Jesse Dominguez is a 28 y.o. male presents emergency department complaining of swelling in his face above a tooth that broke off a few days ago.  No fever or chills.  No drainage from the tooth.  He states he actually does not have any pain at the tooth and just feels a knot in his face.    History reviewed. No pertinent past medical history.  There are no active problems to display for this patient.   History reviewed. No pertinent surgical history.  Prior to Admission medications   Medication Sig Start Date End Date Taking? Authorizing Provider  amoxicillin (AMOXIL) 500 MG capsule Take 1 capsule (500 mg total) by mouth 3 (three) times daily. 01/16/19   Versie Starks, PA-C    Allergies Patient has no known allergies.  No family history on file.  Social History Social History   Tobacco Use  . Smoking status: Never Smoker  . Smokeless tobacco: Never Used  Substance Use Topics  . Alcohol use: No  . Drug use: Yes    Types: Marijuana    Comment: last used Thursday    Review of Systems  Constitutional: No fever/chills Eyes: No visual changes. ENT: No sore throat.  Positive for left-sided facial swelling and broken tooth Respiratory: Denies cough Genitourinary: Negative for dysuria. Musculoskeletal: Negative for back pain. Skin: Negative for rash.    ____________________________________________   PHYSICAL EXAM:  VITAL SIGNS: ED Triage Vitals [01/16/19 1657]  Enc Vitals Group     BP 122/77     Pulse Rate 75     Resp 16     Temp 97.9 F (36.6 C)     Temp Source Oral     SpO2 98 %     Weight 235 lb (106.6 kg)     Height 6' (1.829 m)     Head  Circumference      Peak Flow      Pain Score 5     Pain Loc      Pain Edu?      Excl. in [place]?     Constitutional: Alert and oriented. Well appearing and in no acute distress. Eyes: Conjunctivae are normal.  Head: Small knot noted in the left maxillary sinus adjacent to the fractured tooth. Nose: No congestion/rhinnorhea. Mouth/Throat: Mucous membranes are moist.  Positive for tooth that is fractured at the gumline on the left upper molars Neck:  supple no lymphadenopathy noted Cardiovascular: Normal rate, regular rhythm. Heart sounds are normal Respiratory: Normal respiratory effort.  No retractions, lungs c t a  GU: deferred Musculoskeletal: FROM all extremities, warm and well perfused Neurologic:  Normal speech and language.  Skin:  Skin is warm, dry and intact. No rash noted. Psychiatric: Mood and affect are normal. Speech and behavior are normal.  ____________________________________________   LABS (all labs ordered are listed, but only abnormal results are displayed)  Labs Reviewed - No data to display ____________________________________________   ____________________________________________  RADIOLOGY    ____________________________________________   PROCEDURES  Procedure(s) performed: No  Procedures    ____________________________________________   INITIAL IMPRESSION / ASSESSMENT AND PLAN / ED COURSE  Pertinent labs & imaging results that were available during my care of  the patient were reviewed by me and considered in my medical decision making (see chart for details).   Patient is a 28 year old male presents emergency department with concerns of a dental abscess.  Physical exam shows patient to appear well.  Small amount noted in the left maxillary sinus area adjacent to the broken tooth.  Explained findings to the patient.  Prescription prescription for amoxicillin.  Patient states he is not needing anything for pain control this time.  He was  discharged in stable condition.    Jesse Dominguez was evaluated in Emergency Department on 01/16/2019 for the symptoms described in the history of present illness. He was evaluated in the context of the global COVID-19 pandemic, which necessitated consideration that the patient might be at risk for infection with the SARS-CoV-2 virus that causes COVID-19. Institutional protocols and algorithms that pertain to the evaluation of patients at risk for COVID-19 are in a state of rapid change based on information released by regulatory bodies including the CDC and federal and state organizations. These policies and algorithms were followed during the patient's care in the ED.   As part of my medical decision making, I reviewed the following data within the electronic MEDICAL RECORD NUMBER Nursing notes reviewed and incorporated, Old chart reviewed, Notes from prior ED visits and [place] Controlled Substance Database  ____________________________________________   FINAL CLINICAL IMPRESSION(S) / ED DIAGNOSES  Final diagnoses:  Dental abscess      NEW MEDICATIONS STARTED DURING THIS VISIT:  New Prescriptions   AMOXICILLIN (AMOXIL) 500 MG CAPSULE    Take 1 capsule (500 mg total) by mouth 3 (three) times daily.     Note:  This document was prepared using Dragon voice recognition software and may include unintentional dictation errors.    Faythe Ghee, PA-C 01/16/19 1726    Arnaldo Natal, MD 01/16/19 [phone]

## 2019-01-16 NOTE — Discharge Instructions (Addendum)
Follow-up with your regular doctor if not better in 3 days.  Return emergency department worsening.  Take medication as prescribed. 

## 2019-01-16 NOTE — ED Triage Notes (Signed)
Pt c.o LFT upper dental pain that has started swelling and hurting around his left side of face. Denies any fever

## 2019-01-16 NOTE — ED Notes (Signed)
Pt verbalized understanding of discharge instructions. NAD at this time. 

## 2021-06-10 ENCOUNTER — Encounter: Payer: Self-pay | Admitting: Emergency Medicine

## 2021-06-10 ENCOUNTER — Other Ambulatory Visit: Payer: Self-pay

## 2021-06-10 ENCOUNTER — Emergency Department
Admission: EM | Admit: 2021-06-10 | Discharge: 2021-06-10 | Disposition: A | Discharge status: Home / Self Care | Payer: Medicaid Other | Attending: Emergency Medicine | Admitting: Emergency Medicine

## 2021-06-10 DIAGNOSIS — S50861A Insect bite (nonvenomous) of right forearm, initial encounter: Secondary | ICD-10-CM | POA: Insufficient documentation

## 2021-06-10 DIAGNOSIS — W57XXXA Bitten or stung by nonvenomous insect and other nonvenomous arthropods, initial encounter: Secondary | ICD-10-CM | POA: Insufficient documentation

## 2021-06-10 DIAGNOSIS — L03113 Cellulitis of right upper limb: Secondary | ICD-10-CM | POA: Insufficient documentation

## 2021-06-10 MED ORDER — CEPHALEXIN 500 MG PO CAPS: 500.0000 mg | ORAL_CAPSULE | Freq: Four times a day (QID) | ORAL | 0 refills | Status: AC

## 2021-06-10 NOTE — ED Notes (Signed)
See triage note  presents with possible spider bite to left forearm  states he noticed area 3 days ago  now area is larger,and swollen

## 2021-06-10 NOTE — ED Provider Notes (Signed)
[facility] Provider Note    Event Date/Time   First MD Initiated Contact with Patient 06/10/21 1044     (approximate)   History   Insect Bite   HPI  Jesse Dominguez is a 31 y.o. male who reports a possible spider bite on his right forearm.  He noticed it 3 days ago and now it is getting bigger.  There is a small black area in the center of it.  The whole thing is about 2-1/2 cm wide and about 4 cm long.  He drew a circle around it yesterday and it has advanced proximately by about a centimeter in 1 area.  He is not sure the spider bit him but he did see a small brown spider after noticing this thing on his arm.  Patient denies any past medical history or allergies.  He is not taking any medications.   Physical Exam   Triage Vital Signs: ED Triage Vitals  Enc Vitals Group     BP 06/10/21 1033 138/79     Pulse Rate 06/10/21 1033 78     Resp 06/10/21 1033 18     Temp 06/10/21 1033 98 F (36.7 C)     Temp Source 06/10/21 1033 Oral     SpO2 06/10/21 1033 98 %     Weight 06/10/21 1012 235 lb (106.6 kg)     Height 06/10/21 1012 5\' 11"  (1.803 m)     Head Circumference --      Peak Flow --      Pain Score 06/10/21 1012 7     Pain Loc --      Pain Edu? --      Excl. in [place]? --     Most recent vital signs: Vitals:   06/10/21 1033  BP: 138/79  Pulse: 78  Resp: 18  Temp: 98 F (36.7 C)  SpO2: 98%    { General: Awake, no distress.  CV:  Good peripheral perfusion.  Resp:  Normal effort.  Abd:  No distention.  Ext: Area of cellulitis on the forearm as described with a small raised area about the size of a dime in the center of it and about a 4 mm blackened area in the center of that.  There is no fluctuance and very little firmness.  Is not particularly painful.   ED Results / Procedures / Treatments   Labs (all labs ordered are listed, but only abnormal results are displayed) Labs Reviewed - No data to  display   EKG     RADIOLOGY    PROCEDURES:  Critical Care performed:   Procedures   MEDICATIONS ORDERED IN ED: Medications - No data to display   IMPRESSION / MDM / [place] / ED COURSE  I reviewed the triage vital signs and the nursing notes. Patient with no past medical history.  No medicines and no allergies.  He may have sustained a spider bite.  He thought maybe it was a wolf spider or possibly could be a brown recluse but we are not doing anything about brown recluse bites these days.  We will monitor him and start him on some Keflex antibiotic.  I did consider admission but do not believe it would be necessary at this point.         FINAL CLINICAL IMPRESSION(S) / ED DIAGNOSES   Final diagnoses:  Cellulitis of right upper extremity  Insect bite of right forearm, initial encounter     Rx /  DC Orders   ED Discharge Orders          Ordered    cephALEXin (KEFLEX) 500 MG capsule  4 times daily        06/10/21 1048             Note:  This document was prepared using Dragon voice recognition software and may include unintentional dictation errors.   Nena Polio, MD 06/10/21 1057

## 2021-06-10 NOTE — Discharge Instructions (Addendum)
I will give you an antibiotic called Keflex.  It is 1 pill 4 times a day.  Start taking it today and I try to take 3 doses today.  Please return if you get red streaking up the arm or if the area on the forearm gets much bigger than what we have circled today.  Also return if it gets bigger and soft like it has fluid in it or if it drains a lot of pus or if it gets very tender.

## 2021-06-10 NOTE — ED Triage Notes (Signed)
Pt via POV from home. Pt c/o a spider bite to the L forearm that happened 3 days ago. Pt states it getting more swollen and more painful. Pt is A&OX4 and NAD.

## 2021-09-17 ENCOUNTER — Emergency Department
Admission: EM | Admit: 2021-09-17 | Discharge: 2021-09-17 | Disposition: A | Discharge status: Home / Self Care | Payer: Medicaid Other | Attending: Emergency Medicine | Admitting: Emergency Medicine

## 2021-09-17 ENCOUNTER — Encounter: Payer: Self-pay | Admitting: Medical Oncology

## 2021-09-17 DIAGNOSIS — K047 Periapical abscess without sinus: Secondary | ICD-10-CM | POA: Insufficient documentation

## 2021-09-17 MED ORDER — LIDOCAINE-EPINEPHRINE 2 %-1:100000 IJ SOLN
1.7000 mL | Freq: Once | INTRAMUSCULAR | Status: AC
  Administered 2021-09-17: 1.7 mL
  Filled 2021-09-17: qty 1.7

## 2021-09-17 MED ORDER — NAPROXEN 500 MG PO TABS: 500.0000 mg | ORAL_TABLET | Freq: Two times a day (BID) | ORAL | 0 refills | Status: AC

## 2021-09-17 MED ORDER — AMOXICILLIN-POT CLAVULANATE 875-125 MG PO TABS: 1.0000 | ORAL_TABLET | Freq: Two times a day (BID) | ORAL | 0 refills | Status: AC

## 2021-09-17 NOTE — ED Provider Notes (Signed)
[facility] Provider Note    Event Date/Time   First MD Initiated Contact with Patient 09/17/21 1511     (approximate)   History   Chief Complaint Facial Pain   HPI Jesse Dominguez is a 31 y.o. male, no remarkable medical history, presents to the emergency department for evaluation of facial pain.  Patient states that he has been experiencing pain along the right side of his face, localized towards the right upper gumline for the past 48 hours.  He states that he has a history of dental issues and frequently gets infection.  He states that other than the pain, he otherwise feels fine.  Denies fever/chills, dysphagia, vision changes, hearing changes, chest pain, shortness of breath, abdominal pain, nausea/vomiting, or diarrhea.  History Limitations: No limitations.        Physical Exam  Triage Vital Signs: ED Triage Vitals  Enc Vitals Group     BP 09/17/21 1411 (!) 152/109     Pulse Rate 09/17/21 1411 94     Resp 09/17/21 1411 18     Temp 09/17/21 1411 98 F (36.7 C)     Temp Source 09/17/21 1411 Oral     SpO2 09/17/21 1411 99 %     Weight 09/17/21 1407 245 lb (111.1 kg)     Height 09/17/21 1407 5\' 11"  (1.803 m)     Head Circumference --      Peak Flow --      Pain Score 09/17/21 1407 7     Pain Loc --      Pain Edu? --      Excl. in [place]? --     Most recent vital signs: Vitals:   09/17/21 1411  BP: (!) 152/109  Pulse: 94  Resp: 18  Temp: 98 F (36.7 C)  SpO2: 99%    General: Awake, NAD.  Skin: Warm, dry. No rashes or lesions.  Eyes: PERRL. Conjunctivae normal.  CV: Good peripheral perfusion.  Resp: Normal effort.  Abd: Soft, non-tender. No distention.  Neuro: At baseline. No gross neurological deficits.   Focused Exam: Poor dentition diffusely, multiple dental caries.  Right upper gumline is notably erythematous, though no obvious abscesses appreciated.   Point-of-care ultrasound shows no evidence of significant cobblestoning or  drainable abscesses.  Physical Exam    ED Results / Procedures / Treatments  Labs (all labs ordered are listed, but only abnormal results are displayed) Labs Reviewed - No data to display   EKG N/A.   RADIOLOGY  ED Provider Interpretation: N/A.  No results found.  PROCEDURES:  Critical Care performed: N/A.  08/02/23Nerve Block  Date/Time: 09/17/2021 4:03 PM Performed by: 11/17/2021, PA Authorized by: Varney Daily, PA   Consent:    Consent obtained:  Verbal   Consent given by:  Patient   Risks discussed:  Infection, nerve damage, swelling and unsuccessful block   Alternatives discussed:  No treatment Universal protocol:    Patient identity confirmed:  Verbally with patient Indications:    Indications:  Pain relief Location:    Body area:  Head   Laterality:  Right Skin anesthesia:    Skin anesthesia method:  None Procedure details:    Block needle gauge:  27 G   Anesthetic injected:  Lidocaine 2% WITH epi   Steroid injected:  None   Injection procedure:  Anatomic landmarks identified and incremental injection   Paresthesia:  None Post-procedure details:    Dressing:  None  Outcome:  Pain improved   Procedure completion:  Tolerated well, no immediate complications    MEDICATIONS ORDERED IN ED: Medications  lidocaine-EPINEPHrine (XYLOCAINE W/EPI) 2 %-1:100000 (with pres) injection 1.7 mL (1.7 mLs Infiltration Given by Other 09/17/21 1547)     IMPRESSION / MDM / ASSESSMENT AND PLAN / ED COURSE  I reviewed the triage vital signs and the nursing notes.                              Differential diagnosis includes, but is not limited to, odontogenic infection, pulpitis, dental abscess, dental caries.  ED Course Patient appears well.  Currently endorsing moderate pain in the right side maxillofacial region, particular along the gumline.  Performed a infraorbital nerve block.  Patient tolerated the procedure well.  Assessment/Plan Presentation  consistent with odontogenic infection versus dental abscess.  No clearly drainable abscess appreciated physical exam or ultrasound.  Provided patient with infraorbital block, which he tolerated well and is now pain-free.  Will provide patient with a prescription for Augmentin.  Advised him to follow-up with his dental provider.  Patient's presentation is most consistent with acute, uncomplicated illness.   Provided the patient with anticipatory guidance, return precautions, and educational material. Encouraged the patient to return to the emergency department at any time if they begin to experience any new or worsening symptoms. Patient expressed understanding and agreed with the plan.       FINAL CLINICAL IMPRESSION(S) / ED DIAGNOSES   Final diagnoses:  Dental infection     Rx / DC Orders   ED Discharge Orders          Ordered    amoxicillin-clavulanate (AUGMENTIN) 875-125 MG tablet  2 times daily        09/17/21 1559    naproxen (NAPROSYN) 500 MG tablet  2 times daily with meals        09/17/21 1559             Note:  This document was prepared using Dragon voice recognition software and may include unintentional dictation errors.   Varney Daily, [place] 09/17/21 1606    Delton Prairie, MD 09/17/21 [phone]

## 2021-09-17 NOTE — ED Notes (Signed)
PA at bedside for procedure.

## 2021-09-17 NOTE — Discharge Instructions (Addendum)
-  Take all of your antibiotics as prescribed  -You may take Tylenol and naproxen as needed for pain.  -Follow-up with your dental provider as discussed.  -Return to the emergency department anytime if you begin to experience any new or worsening symptoms.

## 2021-09-17 NOTE — ED Triage Notes (Signed)
Pt reports that he began yesterday having rt sided facial/dental pain.

## 2021-09-18 ENCOUNTER — Emergency Department
Admission: EM | Admit: 2021-09-18 | Discharge: 2021-09-18 | Disposition: A | Discharge status: Home / Self Care | Payer: Medicaid Other | Attending: Student in an Organized Health Care Education/Training Program | Admitting: Student in an Organized Health Care Education/Training Program

## 2021-09-18 ENCOUNTER — Other Ambulatory Visit: Payer: Self-pay

## 2021-09-18 ENCOUNTER — Encounter: Payer: Self-pay | Admitting: Emergency Medicine

## 2021-09-18 DIAGNOSIS — K047 Periapical abscess without sinus: Secondary | ICD-10-CM | POA: Insufficient documentation

## 2021-09-18 MED ORDER — SODIUM CHLORIDE 0.9 % IV SOLN
1.0000 g | Freq: Once | INTRAVENOUS | Status: AC
  Administered 2021-09-18: 1 g via INTRAVENOUS
  Filled 2021-09-18: qty 10

## 2021-09-18 NOTE — ED Notes (Signed)
See triage note. Pt was seen yesterday for dental abscess on R side. Pt states pain is less than yesterday but swelling has increased to R side of face and towards R eye and wants to be sure infx not spreading. R face/cheek is visibly swollen compared to L.

## 2021-09-18 NOTE — Discharge Instructions (Signed)
Follow-up with your regular dentist.  Or follow-up with one of the discounted dentist listed below.  Continue your amoxicillin.  Return if worsening  OPTIONS FOR DENTAL FOLLOW UP CARE  [place] [facility] - Local Safety Net Dental Clinics [email or web address]   [facility] [phone])  [facility] [phone])  [place] [phone])  [facility] [phone])  [facility] [phone]) This clinic caters to the indigent population and is on a lottery system. Location: [employer] [facility], [employer], [address], [place] Clinic Hours: Wednesdays from 6pm - 9pm, patients seen by a lottery system. For dates, call or go to [email or web address] Services: Cleanings, fillings and simple extractions. Payment Options: DENTAL WORK IS FREE OF CHARGE. Bring proof of income or support. Best way to get seen: Arrive at 5:15 pm - this is a lottery, NOT first come/first serve, so arriving earlier will not increase your chances of being seen.     [facility] [phone] Select option 1 for emergencies   Location: [facility], [place], [address], [place] Clinic Hours: No walk-ins accepted - call the day before to schedule an appointment. Check in times are 9:30 am and 1:30 pm. Services: Simple extractions, temporary fillings, pulpectomy/pulp debridement, uncomplicated abscess drainage. Payment Options: PAYMENT IS DUE AT THE TIME OF SERVICE.  Fee is usually $100-200, additional surgical procedures (e.g. abscess drainage) may be extra. Cash, checks, Visa/MasterCard accepted.  Can file Medicaid if patient is covered for dental - patient should call case worker to check. No discount for [facility] patients. Best way to get seen: MUST call the day  before and get onto the schedule. Can usually be seen the next 1-2 days. No walk-ins accepted.     [facility] [phone]   Location: [facility], [address] [place] Clinic Hours: M, W, Th, F 8am or 1:30pm, Tues 9a or 1:30 - first come/first served. Services: Simple extractions, temporary fillings, uncomplicated abscess drainage.  You do not need to be an [facility] resident. Payment Options: PAYMENT IS DUE AT THE TIME OF SERVICE. Dental insurance, otherwise sliding scale - bring proof of income or support. Depending on income and treatment needed, cost is usually $50-200. Best way to get seen: Arrive early as it is first come/first served.     [facility] [phone]   Location: [address] Clinic Hours: Mon-Thu 8a-5p Services: Most basic dental services including extractions and fillings. Payment Options: PAYMENT IS DUE AT THE TIME OF SERVICE. Sliding scale, up to 50% off - bring proof if income or support. Medicaid with dental option accepted. Best way to get seen: Call to schedule an appointment, can usually be seen within 2 weeks OR they will try to see walk-ins - show up at 8a or 2p (you may have to wait).     [facility] [phone] [place] RESIDENTS ONLY   Location: [facility], [address], [place], [place] [address] Clinic Hours: By appointment only. Monday - Thursday 8am-5pm, Friday 8am-12pm Services: Cleanings, fillings, extractions. Payment Options: PAYMENT IS DUE AT THE TIME OF SERVICE. Cash, Visa or MasterCard. Sliding scale - $30 minimum per service. Best way to get seen: Come in to office, complete packet and make an appointment - need proof of income or support monies for each household member and proof of [facility]  residence. Usually takes about a month to get in.     [facility] [phone]   Location: [address], [place] Clinic Hours: Walk-in Urgent Care Dental Services are offered Monday-Friday mornings only. The numbers of emergencies accepted daily is limited to the number of providers available. Maximum 15 - Mondays, Wednesdays & Thursdays Maximum 10 - Tuesdays & Fridays Services: You do not need to be a [facility] resident to be seen for a dental emergency. Emergencies are defined as pain, swelling, abnormal bleeding, or dental trauma. Walkins will receive x-rays if needed. NOTE: Dental cleaning is not an emergency. Payment Options: PAYMENT IS DUE AT THE TIME OF SERVICE. Minimum co-pay is $40.00 for uninsured patients. Minimum co-pay is $3.00 for Medicaid with dental coverage. Dental Insurance is accepted and must be presented at time of visit. Medicare does not cover dental. Forms of payment: Cash, credit card, checks. Best way to get seen: If not previously registered with the clinic, walk-in dental registration begins at 7:15 am and is on a first come/first serve basis. If previously registered with the clinic, call to make an appointment.     [facility] [place] ONLY   Location: [address] [place], [place] Clinic Hours: Mon-Thu 10a-2p Services: Extractions only! Payment Options: FREE (donations accepted) - bring proof of income or support Best way to get seen: Call and schedule an appointment OR come at 8am on the 1st Monday of every month (except for holidays) when it is first come/first served.     [facility] [phone]   Location: [place], [place] Clinic Hours: Friday mornings Services, Payment Options, Best way to get seen: Call for info

## 2021-09-18 NOTE — ED Provider Notes (Signed)
   [facility] Provider Note    Event Date/Time   First MD Initiated Contact with Patient 09/18/21 [phone]     (approximate)   History   Facial Swelling   HPI  Jesse Dominguez is a 31 y.o. male presents emergency department for increased swelling on the right side of his face.  Patient was seen here yesterday and given a dental block and antibiotic.  Patient's had 2 doses of amoxicillin.  States pain is better however the swelling has gotten worse.  Denies fever or chills.      Physical Exam   Triage Vital Signs: ED Triage Vitals  Enc Vitals Group     BP --      Pulse --      Resp --      Temp --      Temp src --      SpO2 --      Weight 09/18/21 0604 245 lb (111.1 kg)     Height 09/18/21 0604 5\' 11"  (1.803 m)     Head Circumference --      Peak Flow --      Pain Score 09/18/21 0603 5     Pain Loc --      Pain Edu? --      Excl. in [place]? --     Most recent vital signs: There were no vitals filed for this visit.   General: Awake, no distress.   CV:  Good peripheral perfusion. regular rate and  rhythm Resp:  Normal effort. Lungs CTA Abd:  No distention.   Other:  Poor dentition noted, reddened area noted at the upper gum but no pustule noted, face is swollen on the right side, tender to palpation   ED Results / Procedures / Treatments   Labs (all labs ordered are listed, but only abnormal results are displayed) Labs Reviewed - No data to display   EKG     RADIOLOGY     PROCEDURES:   Procedures   MEDICATIONS ORDERED IN ED: Medications  cefTRIAXone (ROCEPHIN) 1 g in sodium chloride 0.9 % 100 mL IVPB (1 g Intravenous New Bag/Given 09/18/21 0731)     IMPRESSION / MDM / [place] / ED COURSE  I reviewed the triage vital signs and the nursing notes.                              Differential diagnosis includes, but is not limited to, dental abscess, cellulitis, tooth pain  Patient's presentation is most consistent  with acute, uncomplicated illness.   Patient be given IV Rocephin 1 g.  He can continue his amoxicillin after that.  Do not feel that he needs admission as it is mild swelling.  Patient is in agreement treatment plan.  Plan at this time is to discharge.      FINAL CLINICAL IMPRESSION(S) / ED DIAGNOSES   Final diagnoses:  Dental abscess     Rx / DC Orders   ED Discharge Orders     None        Note:  This document was prepared using Dragon voice recognition software and may include unintentional dictation errors.    Versie Starks, PA-C 09/18/21 [id number]    Merlyn Lot, MD 09/18/21 [phone]

## 2021-09-18 NOTE — ED Triage Notes (Signed)
   Patient comes in with R sided facial swelling that he noticed when he woke up this morning.  Patient states he was seen yesterday for dental abscess, and was given numbing injection and antibiotics.  Patient states he was told to come back in if he developed facial swelling.  R cheek is moderately swollen and patient endorses pressure extending towards lacrimal duct.  Pain 5/10, pressure in face.

## 2022-03-18 DIAGNOSIS — Z419 Encounter for procedure for purposes other than remedying health state, unspecified: Secondary | ICD-10-CM | POA: Diagnosis not present

## 2022-04-18 DIAGNOSIS — Z419 Encounter for procedure for purposes other than remedying health state, unspecified: Secondary | ICD-10-CM | POA: Diagnosis not present

## 2022-05-19 DIAGNOSIS — Z419 Encounter for procedure for purposes other than remedying health state, unspecified: Secondary | ICD-10-CM | POA: Diagnosis not present

## 2022-06-17 DIAGNOSIS — Z419 Encounter for procedure for purposes other than remedying health state, unspecified: Secondary | ICD-10-CM | POA: Diagnosis not present

## 2022-07-18 DIAGNOSIS — Z419 Encounter for procedure for purposes other than remedying health state, unspecified: Secondary | ICD-10-CM | POA: Diagnosis not present

## 2022-08-17 DIAGNOSIS — Z419 Encounter for procedure for purposes other than remedying health state, unspecified: Secondary | ICD-10-CM | POA: Diagnosis not present

## 2022-09-17 DIAGNOSIS — Z419 Encounter for procedure for purposes other than remedying health state, unspecified: Secondary | ICD-10-CM | POA: Diagnosis not present

## 2022-10-17 DIAGNOSIS — Z419 Encounter for procedure for purposes other than remedying health state, unspecified: Secondary | ICD-10-CM | POA: Diagnosis not present

## 2022-11-17 DIAGNOSIS — Z419 Encounter for procedure for purposes other than remedying health state, unspecified: Secondary | ICD-10-CM | POA: Diagnosis not present

## 2022-12-18 DIAGNOSIS — Z419 Encounter for procedure for purposes other than remedying health state, unspecified: Secondary | ICD-10-CM | POA: Diagnosis not present

## 2023-01-17 DIAGNOSIS — Z419 Encounter for procedure for purposes other than remedying health state, unspecified: Secondary | ICD-10-CM | POA: Diagnosis not present

## 2023-02-03 DIAGNOSIS — K029 Dental caries, unspecified: Secondary | ICD-10-CM | POA: Insufficient documentation

## 2023-02-03 DIAGNOSIS — K0889 Other specified disorders of teeth and supporting structures: Secondary | ICD-10-CM | POA: Diagnosis not present

## 2023-02-03 DIAGNOSIS — K047 Periapical abscess without sinus: Secondary | ICD-10-CM | POA: Diagnosis not present

## 2023-02-04 ENCOUNTER — Emergency Department
Admission: EM | Admit: 2023-02-04 | Discharge: 2023-02-04 | Disposition: A | Discharge status: Home / Self Care | Payer: 59 | Attending: Emergency Medicine | Admitting: Emergency Medicine

## 2023-02-04 ENCOUNTER — Other Ambulatory Visit: Payer: Self-pay

## 2023-02-04 DIAGNOSIS — K047 Periapical abscess without sinus: Secondary | ICD-10-CM

## 2023-02-04 DIAGNOSIS — K029 Dental caries, unspecified: Secondary | ICD-10-CM

## 2023-02-04 MED ORDER — CHLORHEXIDINE GLUCONATE 0.12 % MT SOLN: 15.0000 mL | Freq: Two times a day (BID) | OROMUCOSAL | 0 refills | Status: AC

## 2023-02-04 MED ORDER — AMOXICILLIN-POT CLAVULANATE 875-125 MG PO TABS: 1.0000 | ORAL_TABLET | Freq: Two times a day (BID) | ORAL | 0 refills | Status: AC

## 2023-02-04 MED ORDER — AMOXICILLIN-POT CLAVULANATE 875-125 MG PO TABS
1.0000 | ORAL_TABLET | Freq: Once | ORAL | Status: AC
  Administered 2023-02-04: 1 via ORAL
  Filled 2023-02-04: qty 1

## 2023-02-04 NOTE — ED Provider Notes (Signed)
[facility] Provider Note    Event Date/Time   First MD Initiated Contact with Patient 02/04/23 [phone]     (approximate)   History   Dental Pain   HPI Jesse Dominguez is a 32 y.o. male who presents for evaluation of dental pain.  He said that he knows he has a lot of chronic dental problems and is trying to get insurance and save up enough money to go to a dentist but he has not yet had the ability to do so.  He said that multiple teeth in the back of his mouth particular in the left side have broken and that he said more pieces broke off a couple of days ago.  The pain started getting worse and he said that he is not too worried about the pain, but he is concerned because sometimes he takes what he believes is infection.  He has developed facial infections before as a result of the chronic dental problems and he does not wanted to get to that point.  He has no difficulty opening his mouth nor swallowing.  He has what he thinks is some swelling and tenderness to palpation of the left side of his face, but it is not yet a substantial issue.     Physical Exam   Triage Vital Signs: ED Triage Vitals  Encounter Vitals Group     BP 02/04/23 0004 129/88     Systolic BP Percentile --      Diastolic BP Percentile --      Pulse Rate 02/04/23 0004 80     Resp 02/04/23 0004 18     Temp 02/04/23 0004 98.3 F (36.8 C)     Temp src --      SpO2 02/04/23 0004 100 %     Weight 02/04/23 0004 108 kg (238 lb)     Height 02/04/23 0004 1.829 m (6')     Head Circumference --      Peak Flow --      Pain Score 02/04/23 0003 4     Pain Loc --      Pain Education --      Exclude from Growth Chart --     Most recent vital signs: Vitals:   02/04/23 0004 02/04/23 0239  BP: 129/88 121/84  Pulse: 80 78  Resp: 18 17  Temp: 98.3 F (36.8 C) 98.1 F (36.7 C)  SpO2: 100% 99%    General: Awake, no distress.  HEENT: Extremely poor dentition throughout.  Difficult to appreciate  what is acute given all of the chronic caries and fractures.  No dental abscess easily identifiable, no indication of Ludwig's angina.  Patient has some tenderness to palpation to his left maxilla but there is no indication of facial cellulitis or abscess at this time. CV:  Good peripheral perfusion.  Resp:  Normal effort. Speaking easily and comfortably, no accessory muscle usage nor intercostal retractions.   Abd:  No distention.    ED Results / Procedures / Treatments   Labs (all labs ordered are listed, but only abnormal results are displayed) Labs Reviewed - No data to display   PROCEDURES:  Critical Care performed: No  Procedures    IMPRESSION / MDM / ASSESSMENT AND PLAN / ED COURSE  I reviewed the triage vital signs and the nursing notes.  Differential diagnosis includes, but is not limited to, dental caries, dental abscess, facial abscess or cellulitis, Ludwig's angina.  Patient's presentation is most consistent with exacerbation of chronic illness.   Interventions/Medications given:  Medications  amoxicillin-clavulanate (AUGMENTIN) 875-125 MG per tablet 1 tablet (1 tablet Oral Given 02/04/23 0232)    (Note:  hospital course my include additional interventions and/or labs/studies not listed above.)  Acute on chronic dental abscess with likely low summary infection but no indication for imaging at this time.  Should be treatable with oral antibiotics.  I ordered Augmentin and a 10-day outpatient course of Augmentin along with Peridex solution for "swish and spit".  I gave the patient the dental resource guide and encouraged him to follow-up as soon as possible.  He understands and agrees.      FINAL CLINICAL IMPRESSION(S) / ED DIAGNOSES   Final diagnoses:  Pain due to dental caries  Dental infection     Rx / DC Orders   ED Discharge Orders          Ordered    amoxicillin-clavulanate (AUGMENTIN) 875-125 MG tablet  2 times daily         02/04/23 0229    chlorhexidine (PERIDEX) 0.12 % solution  2 times daily        02/04/23 0229             Note:  This document was prepared using Dragon voice recognition software and may include unintentional dictation errors.   Loleta Rose, MD 02/04/23 [phone]

## 2023-02-04 NOTE — Discharge Instructions (Signed)

## 2023-02-04 NOTE — ED Triage Notes (Signed)
Pt reports dental pain in left upper part of mouth where teeth are broken and back behind. Pt reports pain started getting worse 2 days ago and has been progressing.

## 2023-02-17 DIAGNOSIS — Z419 Encounter for procedure for purposes other than remedying health state, unspecified: Secondary | ICD-10-CM | POA: Diagnosis not present

## 2023-03-19 DIAGNOSIS — Z419 Encounter for procedure for purposes other than remedying health state, unspecified: Secondary | ICD-10-CM | POA: Diagnosis not present

## 2023-03-20 ENCOUNTER — Emergency Department
Admission: EM | Admit: 2023-03-20 | Discharge: 2023-03-20 | Disposition: A | Discharge status: Home / Self Care | Payer: 59 | Attending: Emergency Medicine | Admitting: Emergency Medicine

## 2023-03-20 ENCOUNTER — Other Ambulatory Visit: Payer: Self-pay

## 2023-03-20 DIAGNOSIS — R220 Localized swelling, mass and lump, head: Secondary | ICD-10-CM | POA: Diagnosis not present

## 2023-03-20 DIAGNOSIS — K047 Periapical abscess without sinus: Secondary | ICD-10-CM | POA: Diagnosis not present

## 2023-03-20 MED ORDER — NAPROXEN 500 MG PO TABS: 500.0000 mg | ORAL_TABLET | Freq: Two times a day (BID) | ORAL | 2 refills | Status: AC

## 2023-03-20 MED ORDER — PENICILLIN V POTASSIUM 500 MG PO TABS: 500.0000 mg | ORAL_TABLET | Freq: Three times a day (TID) | ORAL | 0 refills | Status: AC

## 2023-03-20 MED ORDER — BENZOCAINE 20 % MT SOLN: Freq: Once | OROMUCOSAL | Status: DC

## 2023-03-20 MED ORDER — NAPROXEN 500 MG PO TABS
500.0000 mg | ORAL_TABLET | Freq: Once | ORAL | Status: AC
  Administered 2023-03-20: 500 mg via ORAL
  Filled 2023-03-20: qty 1

## 2023-03-20 MED ORDER — PENICILLIN V POTASSIUM 500 MG PO TABS
500.0000 mg | ORAL_TABLET | Freq: Once | ORAL | Status: AC
  Administered 2023-03-20: 500 mg via ORAL
  Filled 2023-03-20: qty 1

## 2023-03-20 MED ORDER — LIDOCAINE VISCOUS HCL 2 % MT SOLN
15.0000 mL | Freq: Once | OROMUCOSAL | Status: AC
  Administered 2023-03-20: 15 mL via OROMUCOSAL
  Filled 2023-03-20: qty 15

## 2023-03-20 NOTE — ED Triage Notes (Signed)
Pt reports a few days ago he had a tooth break on the upper left side, pt reports he noticed swelling and drainage coming from area. Pt denies fevers at home.

## 2023-03-20 NOTE — ED Provider Notes (Signed)
[facility] Provider Note    Event Date/Time   First MD Initiated Contact with Patient 03/20/23 [phone]     (approximate)   History   Dental Pain   HPI  Jesse Dominguez is a 32 y.o. male who presents with complaints of dental pain.  Reports has developed small mount of swelling in the last 24 hours in his left face.  Notes that there is a "lump on his gum near fractured tooth     Physical Exam   Triage Vital Signs: ED Triage Vitals  Encounter Vitals Group     BP 03/20/23 0657 (!) 135/95     Systolic BP Percentile --      Diastolic BP Percentile --      Pulse Rate 03/20/23 0657 (!) 105     Resp 03/20/23 0657 18     Temp 03/20/23 0657 98.4 F (36.9 C)     Temp Source 03/20/23 0657 Oral     SpO2 03/20/23 0657 100 %     Weight 03/20/23 0713 108 kg (238 lb 1.6 oz)     Height 03/20/23 0713 1.829 m (6')     Head Circumference --      Peak Flow --      Pain Score --      Pain Loc --      Pain Education --      Exclude from Growth Chart --     Most recent vital signs: Vitals:   03/20/23 0657  BP: (!) 135/95  Pulse: (!) 105  Resp: 18  Temp: 98.4 F (36.9 C)  SpO2: 100%     General: Awake, no distress.  CV:  Good peripheral perfusion.  Resp:  Normal effort.  Abd:  No distention.  Other:  Evidence of dental abscess above 11th tooth, mild left facial swelling, no pharyngeal swelling or otherwise intra oral swelling   ED Results / Procedures / Treatments   Labs (all labs ordered are listed, but only abnormal results are displayed) Labs Reviewed - No data to display   EKG     RADIOLOGY     PROCEDURES:  Critical Care performed: yes  .Incision and Drainage  Date/Time: 03/20/2023 7:49 AM  Performed by: Jene Every, MD Authorized by: Jene Every, MD   Consent:    Consent obtained:  Verbal   Consent given by:  Patient   Risks discussed:  Bleeding, pain, incomplete drainage and infection Location:    Type:  Abscess    Location:  Mouth Sedation:    Sedation type:  None Anesthesia:    Anesthesia method:  Topical application   Topical anesthetic:  Lidocaine gel Procedure type:    Complexity:  Simple Procedure details:    Incision types:  Stab incision   Drainage:  Purulent   Drainage amount:  Moderate   Packing materials:  None Post-procedure details:    Procedure completion:  Tolerated well, no immediate complications    MEDICATIONS ORDERED IN ED: Medications  penicillin v potassium (VEETID) tablet 500 mg (has no administration in time range)  naproxen (NAPROSYN) tablet 500 mg (has no administration in time range)  lidocaine (XYLOCAINE) 2 % viscous mouth solution 15 mL (15 mLs Mouth/Throat Given 03/20/23 0742)     IMPRESSION / MDM / ASSESSMENT AND PLAN / ED COURSE  I reviewed the triage vital signs and the nursing notes. Patient's presentation is most consistent with acute, uncomplicated illness.  Patient presents with dental abscess.  Overall well-appearing and  in no acute distress.  Have applied viscous lidocaine to the abscess, will I&D and start antibiotics.  Will give a dose of penicillin and Naprosyn here, prescription sent to his pharmacy      FINAL CLINICAL IMPRESSION(S) / ED DIAGNOSES   Final diagnoses:  Dental abscess     Rx / DC Orders   ED Discharge Orders          Ordered    penicillin v potassium (VEETID) 500 MG tablet  3 times daily        03/20/23 0749    naproxen (NAPROSYN) 500 MG tablet  2 times daily with meals        03/20/23 0749             Note:  This document was prepared using Dragon voice recognition software and may include unintentional dictation errors.   Jene Every, MD 03/20/23 [phone]

## 2023-03-20 NOTE — ED Notes (Signed)
See triage note   Presents with possible dental  abscess  States he broke a tooth couple, of days ago   Has some swelling to gum area  with some drainage  Afebrile

## 2023-04-19 DIAGNOSIS — Z419 Encounter for procedure for purposes other than remedying health state, unspecified: Secondary | ICD-10-CM | POA: Diagnosis not present

## 2023-05-20 DIAGNOSIS — Z419 Encounter for procedure for purposes other than remedying health state, unspecified: Secondary | ICD-10-CM | POA: Diagnosis not present

## 2023-06-17 DIAGNOSIS — Z419 Encounter for procedure for purposes other than remedying health state, unspecified: Secondary | ICD-10-CM | POA: Diagnosis not present

## 2023-08-28 DIAGNOSIS — Z419 Encounter for procedure for purposes other than remedying health state, unspecified: Secondary | ICD-10-CM | POA: Diagnosis not present

## 2023-09-28 DIAGNOSIS — Z419 Encounter for procedure for purposes other than remedying health state, unspecified: Secondary | ICD-10-CM | POA: Diagnosis not present

## 2023-10-06 ENCOUNTER — Emergency Department
Admission: EM | Admit: 2023-10-06 | Discharge: 2023-10-06 | Disposition: A | Discharge status: Home / Self Care | Attending: Emergency Medicine | Admitting: Emergency Medicine

## 2023-10-06 ENCOUNTER — Other Ambulatory Visit: Payer: Self-pay

## 2023-10-06 ENCOUNTER — Encounter: Payer: Self-pay | Admitting: Emergency Medicine

## 2023-10-06 DIAGNOSIS — L0501 Pilonidal cyst with abscess: Secondary | ICD-10-CM | POA: Diagnosis not present

## 2023-10-06 DIAGNOSIS — L0591 Pilonidal cyst without abscess: Secondary | ICD-10-CM

## 2023-10-06 MED ORDER — LIDOCAINE HCL (PF) 1 % IJ SOLN
5.0000 mL | Freq: Once | INTRAMUSCULAR | Status: AC
  Administered 2023-10-06: 5 mL via INTRADERMAL
  Filled 2023-10-06: qty 5

## 2023-10-06 MED ORDER — DOXYCYCLINE HYCLATE 100 MG PO CAPS: 100.0000 mg | ORAL_CAPSULE | Freq: Two times a day (BID) | ORAL | 0 refills | Status: AC

## 2023-10-06 NOTE — ED Notes (Signed)
 This RN reviewed paperwork with pt. No further complaints or questions. Pt ambulated to lobby.

## 2023-10-06 NOTE — Discharge Instructions (Addendum)
 Please schedule a follow up appointment with general surgery, their information is attached.   Leave the packing in place for 3 days, then you can remove it. Please do warm compresses or sitz baths to help with wound healing and to encourage it to continue to drain.   Take the antibiotics as prescribed. You can take 650 mg of Tylenol  and 600 mg of ibuprofen every 6 hours as needed for pain.

## 2023-10-06 NOTE — ED Triage Notes (Signed)
 Pt to ED via POV. Pt states that he has a cyst on the bottom of his tailbone. Pt reports that it has been there for about 5 day. Pt states that he has hx/o same.

## 2023-10-06 NOTE — ED Provider Notes (Signed)
 [facility] Provider Note    Event Date/Time   First MD Initiated Contact with Patient 10/06/23 1311     (approximate)   History   Abscess   HPI  Jesse Dominguez is a 33 y.o. male with PMH of pilonidal cyst presents for evaluation of a pilonidal cyst.  Patient states he has had pain for about 5 days.  He states that it has not began to drain on its own yet.  He has significant pain to the area and cannot sit comfortably.  Patient states that he has flares about twice a year.  He has not been able to see a surgeon yet due to insurance.      Physical Exam   Triage Vital Signs: ED Triage Vitals  Encounter Vitals Group     BP 10/06/23 1300 114/85     Girls Systolic BP Percentile --      Girls Diastolic BP Percentile --      Boys Systolic BP Percentile --      Boys Diastolic BP Percentile --      Pulse Rate 10/06/23 1300 (!) 108     Resp 10/06/23 1300 18     Temp 10/06/23 1300 98.4 F (36.9 C)     Temp Source 10/06/23 1300 Oral     SpO2 10/06/23 1300 98 %     Weight 10/06/23 1305 227 lb (103 kg)     Height 10/06/23 1305 6' (1.829 m)     Head Circumference --      Peak Flow --      Pain Score 10/06/23 1304 5     Pain Loc --      Pain Education --      Exclude from Growth Chart --     Most recent vital signs: Vitals:   10/06/23 1300  BP: 114/85  Pulse: (!) 108  Resp: 18  Temp: 98.4 F (36.9 C)  SpO2: 98%   General: Awake, no distress.  CV:  Good peripheral perfusion.  Resp:  Normal effort.  Abd:  No distention.  Other:  Cyst a few centimeters in length with overlying erythema on the right side of the natal cleft, very tender to palpation.   ED Results / Procedures / Treatments   Labs (all labs ordered are listed, but only abnormal results are displayed) Labs Reviewed - No data to display   PROCEDURES:  Critical Care performed: No  .Incision and Drainage  Date/Time: 10/06/2023 2:43 PM  Performed by: Phyliss Breen,  PA-C Authorized by: Phyliss Breen, PA-C   Consent:    Consent obtained:  Verbal   Consent given by:  Patient   Risks, benefits, and alternatives were discussed: yes     Risks discussed:  Bleeding, incomplete drainage, pain and infection   Alternatives discussed:  No treatment Universal protocol:    Patient identity confirmed:  Verbally with patient Location:    Type:  Pilonidal cyst   Size:  Right gluteal cleft   Location:  Anogenital   Anogenital location:  Pilonidal Pre-procedure details:    Skin preparation:  Povidone-iodine Sedation:    Sedation type:  None Anesthesia:    Anesthesia method:  Local infiltration   Local anesthetic:  Lidocaine  1% w/o epi Procedure type:    Complexity:  Simple Procedure details:    Incision types:  Single straight   Incision depth:  Subcutaneous   Wound management:  Probed and deloculated   Drainage:  Bloody  Drainage amount:  Scant   Packing materials:  1/4 in iodoform gauze   Amount 1/4 iodoform:  3 cm Post-procedure details:    Procedure completion:  Tolerated well, no immediate complications    MEDICATIONS ORDERED IN ED: Medications  lidocaine  (PF) (XYLOCAINE ) 1 % injection 5 mL (5 mLs Intradermal Given by Other 10/06/23 1410)     IMPRESSION / MDM / ASSESSMENT AND PLAN / ED COURSE  I reviewed the triage vital signs and the nursing notes.                             33 year old male presents for evaluation of pilonidal cyst.  Patient is tachycardic on presentation otherwise vital signs are stable.  Patient is uncomfortable and nervous on exam.  Differential diagnosis includes, but is not limited to, pilonidal cyst, anorectal abscess, hemorrhoids, anal fissure.  Patient's presentation is most consistent with acute, uncomplicated illness.  Patient's presentation is consistent with a pilonidal cyst.  Discussed incision and drainage with the patient.  He was agreeable.  Please see procedure note for further  details.  Patient was instructed on wound care, will get him started on oral antibiotics. He reported an improvement in his pain after the procedure. He was given follow up instructions with general surgery.   Patient voiced understanding, all questions were answered and he was stable at discharge.       FINAL CLINICAL IMPRESSION(S) / ED DIAGNOSES   Final diagnoses:  Pilonidal cyst     Rx / DC Orders   ED Discharge Orders          Ordered    doxycycline (VIBRAMYCIN) 100 MG capsule  2 times daily        10/06/23 1440             Note:  This document was prepared using Dragon voice recognition software and may include unintentional dictation errors.   Phyliss Breen, PA-C 10/06/23 1445    Ruth Cove, MD 10/06/23 [phone]

## 2023-10-06 NOTE — ED Notes (Signed)
 ED Provider at bedside.

## 2023-10-28 DIAGNOSIS — Z419 Encounter for procedure for purposes other than remedying health state, unspecified: Secondary | ICD-10-CM | POA: Diagnosis not present

## 2023-11-28 DIAGNOSIS — Z419 Encounter for procedure for purposes other than remedying health state, unspecified: Secondary | ICD-10-CM | POA: Diagnosis not present

## 2023-12-29 DIAGNOSIS — Z419 Encounter for procedure for purposes other than remedying health state, unspecified: Secondary | ICD-10-CM | POA: Diagnosis not present

## 2024-04-18 ENCOUNTER — Emergency Department

## 2024-04-18 ENCOUNTER — Other Ambulatory Visit: Payer: Self-pay

## 2024-04-18 ENCOUNTER — Emergency Department
Admission: EM | Admit: 2024-04-18 | Discharge: 2024-04-18 | Disposition: A | Discharge status: Home / Self Care | Source: Home / Self Care

## 2024-04-18 DIAGNOSIS — N492 Inflammatory disorders of scrotum: Secondary | ICD-10-CM | POA: Diagnosis present

## 2024-04-18 MED ORDER — LIDOCAINE HCL (PF) 1 % IJ SOLN
5.0000 mL | Freq: Once | INTRAMUSCULAR | Status: AC
  Administered 2024-04-18: 5 mL via INTRADERMAL
  Filled 2024-04-18: qty 5

## 2024-04-18 MED ORDER — LIDOCAINE-PRILOCAINE 2.5-2.5 % EX CREA
TOPICAL_CREAM | Freq: Once | CUTANEOUS | Status: AC
  Filled 2024-04-18: qty 5

## 2024-04-18 MED ORDER — DOXYCYCLINE HYCLATE 100 MG PO TABS: 100.0000 mg | ORAL_TABLET | Freq: Two times a day (BID) | ORAL | 0 refills | Status: AC

## 2024-04-18 MED ORDER — OXYCODONE-ACETAMINOPHEN 5-325 MG PO TABS
1.0000 | ORAL_TABLET | Freq: Once | ORAL | Status: AC
  Administered 2024-04-18: 1 via ORAL
  Filled 2024-04-18: qty 1

## 2024-04-18 MED ORDER — LIDOCAINE HCL (PF) 1 % IJ SOLN
2.0000 mL | INTRAMUSCULAR | Status: DC
  Administered 2024-04-18: 2 mL

## 2024-04-18 MED ORDER — LIDOCAINE-PRILOCAINE 2.5-2.5 % EX CREA: TOPICAL_CREAM | CUTANEOUS | Status: DC

## 2024-04-18 MED ORDER — LIDOCAINE-EPINEPHRINE-TETRACAINE (LET) TOPICAL GEL: 3.0000 mL | Freq: Once | TOPICAL | Status: DC

## 2024-04-18 NOTE — ED Provider Notes (Signed)
 "  [facility] Provider Note    Event Date/Time   First MD Initiated Contact with Patient 04/18/24 1029     (approximate)   History   Abscess   HPI  Jesse Dominguez is a 34 y.o. male with no significant past medical history presents emergency department with possible abscess on the left side of his scrotum.  Pain when walking.  Noticed some redness and swelling.  No fever or chills.  No known injury.  Does shave in this area.      Physical Exam   Triage Vital Signs: ED Triage Vitals [04/18/24 0942]  Encounter Vitals Group     BP (!) 136/95     Girls Systolic BP Percentile      Girls Diastolic BP Percentile      Boys Systolic BP Percentile      Boys Diastolic BP Percentile      Pulse Rate (!) 114     Resp 20     Temp 99 F (37.2 C)     Temp Source Oral     SpO2 98 %     Weight      Height      Head Circumference      Peak Flow      Pain Score 0     Pain Loc      Pain Education      Exclude from Growth Chart     Most recent vital signs: Vitals:   04/18/24 0942 04/18/24 1102  BP: (!) 136/95   Pulse: (!) 114   Resp: 20   Temp: 99 F (37.2 C)   SpO2: 98% 98%     General: Awake, no distress.   CV:  Good peripheral perfusion.  Resp:  Normal effort.  Abd:  No distention.   Other:  Scrotal with some swelling and redness noted on the left side, area is nonfluctuant, no pustule noted, area is very tender to palpation, neurovascular intact, no necrosis noted   ED Results / Procedures / Treatments   Labs (all labs ordered are listed, but only abnormal results are displayed) Labs Reviewed  AEROBIC CULTURE W GRAM STAIN (SUPERFICIAL SPECIMEN)     EKG     RADIOLOGY Ultrasound scrotum    PROCEDURES:   .Incision and Drainage  Date/Time: 04/18/2024 4:36 PM  Performed by: Gasper Devere ORN, PA-C Authorized by: Gasper Devere ORN, PA-C   Consent:    Consent obtained:  Verbal   Consent given by:  Patient   Risks, benefits, and  alternatives were discussed: yes     Risks discussed:  Bleeding, incomplete drainage, pain, damage to other organs and infection   Alternatives discussed:  No treatment Universal protocol:    Procedure explained and questions answered to patient or proxy's satisfaction: yes     Immediately prior to procedure, a time out was called: yes     Patient identity confirmed:  Verbally with patient Location:    Type:  Abscess   Location:  Anogenital   Anogenital location:  Scrotal wall Pre-procedure details:    Skin preparation:  Povidone-iodine Sedation:    Sedation type:  None Anesthesia:    Anesthesia method:  Topical application and local infiltration   Topical anesthetic:  Lidocaine -prilocaine 2.5-2.5 %   Local anesthetic:  2 mL lidocaine  (PF) 1 % Procedure type:    Complexity:  Simple Procedure details:    Ultrasound guidance: no     Needle aspiration: no  Incision types:  Stab incision   Wound management:  Probed and deloculated   Drainage:  Purulent and bloody   Drainage amount:  Copious   Wound treatment:  Wound left open Post-procedure details:    Procedure completion:  Tolerated well, no immediate complications   Critical Care:  no Chief Complaint  Patient presents with   Abscess      MEDICATIONS ORDERED IN ED: Medications  oxyCODONE -acetaminophen  (PERCOCET/ROXICET) 5-325 MG per tablet 1 tablet (1 tablet Oral Given 04/18/24 1105)  lidocaine  (PF) (XYLOCAINE ) 1 % injection 5 mL (5 mLs Intradermal Given 04/18/24 1455)  lidocaine -prilocaine (EMLA) cream ( Topical Given by Other 04/18/24 1438)     IMPRESSION / MDM / ASSESSMENT AND PLAN / ED COURSE  I reviewed the triage vital signs and the nursing notes.                              Differential diagnosis includes, but is not limited to, abscess, hydrocele, cellulitis  Patient's presentation is most consistent with acute illness / injury with system symptoms.   Medications given: Percocet 1 p.o.  Ultrasound  scrotum ordered   Ultrasound scrotum, independent review interpretation by me of radiologist reading as being positive for an abscess.  No Fournier's gangrene or necrosis noted at this time.  Will go ahead and do an incision and drainage of the area.  Emla cream applied  See procedure note for incision and drainage.  Did order a wound culture to assess for bacterial resistance.  Patient was placed on doxycycline .  Follow-up with his regular doctor in 2 to 3 days as needed.  Return if worsening.  He is in agreement with this treatment plan.  Discharged stable condition.   FINAL CLINICAL IMPRESSION(S) / ED DIAGNOSES   Final diagnoses:  Scrotal abscess     Rx / DC Orders   ED Discharge Orders          Ordered    doxycycline  (VIBRA -TABS) 100 MG tablet  2 times daily        04/18/24 1508             Note:  This document was prepared using Dragon voice recognition software and may include unintentional dictation errors.    Gasper Devere ORN, PA-C 04/18/24 1638  "

## 2024-04-18 NOTE — ED Triage Notes (Signed)
 Pt to Ed via POV from home. Pt reports possible abscess on scrotum x4 days. Pt reports only pain when walking.

## 2024-04-18 NOTE — Discharge Instructions (Signed)
 Follow-up with your regular doctor if not improving in 3 days.  Return if worsening.  Take the antibiotic as prescribed.  A wound culture was performed and if the antibiotic is not the correct 1 they will call you and call in a separate antibiotic.

## 2024-04-22 LAB — AEROBIC CULTURE W GRAM STAIN (SUPERFICIAL SPECIMEN): Culture: NO GROWTH
